# Patient Record
Sex: Male | Born: 1960 | Race: Black or African American | Hispanic: No | Smoking: Former smoker
Health system: Southern US, Community
[De-identification: ages and names within clinical notes are randomized; demographics above are authoritative.]

## PROBLEM LIST (undated history)

## (undated) DIAGNOSIS — B20 Human immunodeficiency virus [HIV] disease: Secondary | ICD-10-CM

## (undated) DIAGNOSIS — Z21 Asymptomatic human immunodeficiency virus [HIV] infection status: Secondary | ICD-10-CM

## (undated) HISTORY — PX: PATELLA ARTHROPLASTY: SUR73

---

## 1997-10-17 ENCOUNTER — Emergency Department (HOSPITAL_COMMUNITY): Admission: EM | Admit: 1997-10-17 | Discharge: 1997-10-17 | Payer: Self-pay | Admitting: Emergency Medicine

## 2009-02-24 ENCOUNTER — Encounter: Admission: RE | Admit: 2009-02-24 | Discharge: 2009-02-24 | Payer: Self-pay | Admitting: Infectious Diseases

## 2010-07-14 ENCOUNTER — Other Ambulatory Visit: Payer: Self-pay | Admitting: Adult Health

## 2010-07-14 ENCOUNTER — Ambulatory Visit: Payer: Self-pay

## 2010-07-14 ENCOUNTER — Encounter: Payer: Self-pay | Admitting: Adult Health

## 2010-07-14 ENCOUNTER — Ambulatory Visit (INDEPENDENT_AMBULATORY_CARE_PROVIDER_SITE_OTHER): Payer: Self-pay

## 2010-07-14 DIAGNOSIS — B20 Human immunodeficiency virus [HIV] disease: Secondary | ICD-10-CM

## 2010-07-14 LAB — CONVERTED CEMR LAB
AST: 42 units/L — ABNORMAL HIGH (ref 0–37)
Albumin: 4.3 g/dL (ref 3.5–5.2)
Alkaline Phosphatase: 116 units/L (ref 39–117)
BUN: 12 mg/dL (ref 6–23)
Basophils Absolute: 0 10*3/uL (ref 0.0–0.1)
Basophils Relative: 0 % (ref 0–1)
Chlamydia, Swab/Urine, PCR: NEGATIVE
Creatinine, Ser: 1.17 mg/dL (ref 0.40–1.50)
Eosinophils Absolute: 0.1 10*3/uL (ref 0.0–0.7)
Eosinophils Relative: 2 % (ref 0–5)
GC Probe Amp, Urine: NEGATIVE
Glucose, Bld: 104 mg/dL — ABNORMAL HIGH (ref 70–99)
HCT: 44.6 % (ref 39.0–52.0)
HDL: 48 mg/dL (ref >39)
HIV 1 RNA Quant: <20 copies/mL (ref <20)
HIV-1 antibody: POSITIVE — AB
HIV-2 Ab: NEGATIVE
Hemoglobin: 15 g/dL (ref 13.0–17.0)
LDL Cholesterol: 130 mg/dL — ABNORMAL HIGH (ref 0–99)
Leukocytes, UA: NEGATIVE
Lymphocytes Relative: 16 % (ref 12–46)
MCHC: 33.6 g/dL (ref 30.0–36.0)
Monocytes Absolute: 0.4 10*3/uL (ref 0.1–1.0)
Monocytes Relative: 11 % (ref 3–12)
Neutro Abs: 2.8 10*3/uL (ref 1.7–7.7)
Nitrite: NEGATIVE
Protein, ur: NEGATIVE mg/dL
RBC: 4.81 M/uL (ref 4.22–5.81)
RDW: 14.3 % (ref 11.5–15.5)
Specific Gravity, Urine: 1.022 (ref 1.005–1.030)
Total CHOL/HDL Ratio: 4.2
Urobilinogen, UA: 0.2 (ref 0.0–1.0)

## 2010-07-15 LAB — T-HELPER CELL (CD4) - (RCID CLINIC ONLY): CD4 % Helper T Cell: 39 % (ref 33–55)

## 2010-07-22 NOTE — Miscellaneous (Signed)
Summary: Orders Update  Clinical Lists Changes  Problems: Added new problem of HIV INFECTION (ICD-042) - Signed Orders: Added new Test order of T-RPR (Syphilis) [phone]) - Signed Added new Test order of T-GC Probe, urine [phone]) - Signed Added new Test order of T-Chlamydia  Probe, urine [phone]) - Signed Added new Test order of T-CD4SP [facility]) (CD4SP) - Signed Added new Test order of T-HIV Viral Load [phone]) - Signed Added new Test order of T-Comprehensive Metabolic Panel ([id number]) - Signed Added new Test order of T-Lipid Profile ([id number]) - Signed Added new Test order of T-CBC w/Diff ([id number]) - Signed Added new Test order of T-HIV Genotype ([id number]) - Signed Added new Test order of T-Urinalysis ([id number]) - Signed Added new Test order of T-HIV Ab Confirmatory Test/Western Blot ([id number]) - Signed     Process Orders Check Orders Results:     [facility]: Order checked:     Talmadge Chad NP NOT AUTHORIZED TO ORDER Tests Sent for requisitioning (July 14, 2010 1:51 PM):     07/14/2010: [facility] -- T-RPR (Syphilis) [phone] (signed)     07/14/2010: [facility] -- T-GC Probe, urine [phone] (signed)     07/14/2010: [facility] -- T-Chlamydia  Probe, urine [phone] (signed)     07/14/2010: [facility] -- T-HIV Viral Load [phone] (signed)     07/14/2010: [facility] -- T-Comprehensive Metabolic Panel [[id number]] (signed)     07/14/2010: [facility] -- T-Lipid Profile [phone] (signed)     07/14/2010: [facility] -- T-CBC w/Diff [[id number]] (signed)     07/14/2010: [facility] -- T-HIV Genotype [phone] (signed)     07/14/2010: [facility] -- T-Urinalysis [[id number]] (signed)     07/14/2010: [facility] -- T-HIV Ab  Confirmatory Test/Western Blot [phone] (signed)

## 2010-07-28 ENCOUNTER — Ambulatory Visit (INDEPENDENT_AMBULATORY_CARE_PROVIDER_SITE_OTHER): Payer: Self-pay | Admitting: Adult Health

## 2010-07-28 DIAGNOSIS — Z0000 Encounter for general adult medical examination without abnormal findings: Secondary | ICD-10-CM

## 2010-08-26 ENCOUNTER — Encounter: Payer: Self-pay | Admitting: Adult Health

## 2010-08-26 ENCOUNTER — Ambulatory Visit (INDEPENDENT_AMBULATORY_CARE_PROVIDER_SITE_OTHER): Payer: Self-pay | Admitting: Adult Health

## 2010-08-26 VITALS — BP 108/71 | HR 85 | Temp 98.6°F | Ht 74.0 in | Wt 170.4 lb

## 2010-08-26 DIAGNOSIS — B20 Human immunodeficiency virus [HIV] disease: Secondary | ICD-10-CM

## 2010-08-26 DIAGNOSIS — Z21 Asymptomatic human immunodeficiency virus [HIV] infection status: Secondary | ICD-10-CM

## 2010-08-27 LAB — T-HELPER CELL (CD4) - (RCID CLINIC ONLY): CD4 % Helper T Cell: 39 % (ref 33–55)

## 2010-08-27 LAB — COMPREHENSIVE METABOLIC PANEL
ALT: 32 U/L (ref 0–53)
AST: 44 U/L — ABNORMAL HIGH (ref 0–37)
Alkaline Phosphatase: 117 U/L (ref 39–117)
CO2: 24 mEq/L (ref 19–32)
Creat: 1.05 mg/dL (ref 0.40–1.50)
Sodium: 135 mEq/L (ref 135–145)
Total Bilirubin: 0.2 mg/dL — ABNORMAL LOW (ref 0.3–1.2)

## 2010-08-27 LAB — CBC WITH DIFFERENTIAL/PLATELET
Basophils Absolute: 0 10*3/uL (ref 0.0–0.1)
Basophils Relative: 0 % (ref 0–1)
Eosinophils Absolute: 0.1 10*3/uL (ref 0.0–0.7)
Eosinophils Relative: 2 % (ref 0–5)
MCH: 31.9 pg (ref 26.0–34.0)
MCHC: 33.8 g/dL (ref 30.0–36.0)
MCV: 94.5 fL (ref 78.0–100.0)
Neutrophils Relative %: 68 % (ref 43–77)
Platelets: 269 10*3/uL (ref 150–400)
RDW: 13.6 % (ref 11.5–15.5)

## 2010-08-28 LAB — HIV-1 RNA QUANT-NO REFLEX-BLD
HIV 1 RNA Quant: <20 {copies}/mL (ref <20)
HIV-1 RNA Quant, Log: <1.3 {Log} (ref <1.30)

## 2010-09-09 ENCOUNTER — Ambulatory Visit (INDEPENDENT_AMBULATORY_CARE_PROVIDER_SITE_OTHER): Payer: Self-pay | Admitting: Adult Health

## 2010-09-09 ENCOUNTER — Ambulatory Visit: Payer: Self-pay

## 2010-09-09 DIAGNOSIS — Z79899 Other long term (current) drug therapy: Secondary | ICD-10-CM

## 2010-09-09 DIAGNOSIS — B20 Human immunodeficiency virus [HIV] disease: Secondary | ICD-10-CM

## 2010-09-09 NOTE — Progress Notes (Signed)
  Subjective:    Patient ID: Scott Khan, male    DOB: 12/06/1960, 50 y.o.   MRN: [id number]  HPI Doing well, but still has not received ARV's through [facility].  Had some leftover meds from prison which he he used until two datys back.  Voices no new physical complaints at present.    Review of Systems  HENT: Negative.   Eyes: Negative.   Respiratory: Negative.   Cardiovascular: Negative.   Gastrointestinal: Negative.   Genitourinary: Negative.   Musculoskeletal: Negative.   Skin: Negative.   Neurological: Negative.   Hematological: Negative.   Psychiatric/Behavioral: Negative.        Objective:   Physical Exam  Constitutional: He is oriented to person, place, and time. He appears well-developed and well-nourished.  HENT:  Head: Normocephalic and atraumatic.  Mouth/Throat: Oropharynx is clear and moist.  Eyes: Conjunctivae and EOM are normal. Pupils are equal, round, and reactive to light.  Neck: Normal range of motion. Neck supple.  Cardiovascular: Normal rate, normal heart sounds and intact distal pulses.   Pulmonary/Chest: Effort normal and breath sounds normal.  Abdominal: Soft. Bowel sounds are normal.  Musculoskeletal: Normal range of motion.  Neurological: He is alert and oriented to person, place, and time. Coordination normal.  Skin: Skin is warm and dry. No rash noted.  Psychiatric: He has a normal mood and affect. His behavior is normal. Judgment and thought content normal.          Assessment & Plan:  HIV:  CD4 410 @ 39% with VL < 20 copies/ml (08/26/10).  Clinically stable.  Recommend to CPM with repeat labs in 6 weeks and 2 month f/u.  Determined his numbers for [facility] were active, but pharmacy did not have his processed paperwork.  We will call [facility] with the appropriate inf9ormation and ask him to go directly to the pharmacy to p/u meds.Marland Kitchen

## 2010-09-14 ENCOUNTER — Emergency Department (HOSPITAL_COMMUNITY)
Admission: EM | Admit: 2010-09-14 | Discharge: 2010-09-14 | Disposition: A | Discharge status: Home / Self Care | Payer: Self-pay | Attending: Emergency Medicine | Admitting: Emergency Medicine

## 2010-09-14 ENCOUNTER — Emergency Department (HOSPITAL_COMMUNITY): Payer: Self-pay

## 2010-09-14 ENCOUNTER — Ambulatory Visit: Payer: Self-pay

## 2010-09-14 DIAGNOSIS — IMO0002 Reserved for concepts with insufficient information to code with codable children: Secondary | ICD-10-CM | POA: Insufficient documentation

## 2010-09-14 DIAGNOSIS — M25519 Pain in unspecified shoulder: Secondary | ICD-10-CM | POA: Insufficient documentation

## 2010-09-14 DIAGNOSIS — S42143A Displaced fracture of glenoid cavity of scapula, unspecified shoulder, initial encounter for closed fracture: Secondary | ICD-10-CM | POA: Insufficient documentation

## 2010-09-14 DIAGNOSIS — Y9372 Activity, wrestling: Secondary | ICD-10-CM | POA: Insufficient documentation

## 2010-10-21 ENCOUNTER — Other Ambulatory Visit (INDEPENDENT_AMBULATORY_CARE_PROVIDER_SITE_OTHER): Payer: Self-pay

## 2010-10-21 DIAGNOSIS — B20 Human immunodeficiency virus [HIV] disease: Secondary | ICD-10-CM

## 2010-10-21 DIAGNOSIS — Z79899 Other long term (current) drug therapy: Secondary | ICD-10-CM

## 2010-10-21 LAB — COMPREHENSIVE METABOLIC PANEL
ALT: 61 U/L — ABNORMAL HIGH (ref 0–53)
AST: 53 U/L — ABNORMAL HIGH (ref 0–37)
Albumin: 4.1 g/dL (ref 3.5–5.2)
BUN: 11 mg/dL (ref 6–23)
Calcium: 9.6 mg/dL (ref 8.4–10.5)
Chloride: 105 mEq/L (ref 96–112)
Potassium: 4.4 mEq/L (ref 3.5–5.3)

## 2010-10-21 LAB — LIPID PANEL
LDL Cholesterol: 163 mg/dL — ABNORMAL HIGH (ref 0–99)
VLDL: 18 mg/dL (ref 0–40)

## 2010-10-22 LAB — CBC WITH DIFFERENTIAL/PLATELET
Basophils Absolute: 0 10*3/uL (ref 0.0–0.1)
HCT: 46.2 % (ref 39.0–52.0)
Lymphocytes Relative: 16 % (ref 12–46)
Lymphs Abs: 0.8 10*3/uL (ref 0.7–4.0)
Monocytes Absolute: 0.5 10*3/uL (ref 0.1–1.0)
Neutro Abs: 3.6 10*3/uL (ref 1.7–7.7)
RBC: 5.02 MIL/uL (ref 4.22–5.81)
RDW: 14 % (ref 11.5–15.5)
WBC: 5.1 10*3/uL (ref 4.0–10.5)

## 2010-10-22 LAB — T-HELPER CELL (CD4) - (RCID CLINIC ONLY): CD4 % Helper T Cell: 37 % (ref 33–55)

## 2010-11-09 ENCOUNTER — Ambulatory Visit: Payer: Self-pay | Admitting: Adult Health

## 2010-11-10 ENCOUNTER — Ambulatory Visit (INDEPENDENT_AMBULATORY_CARE_PROVIDER_SITE_OTHER): Payer: Self-pay | Admitting: Internal Medicine

## 2010-11-10 ENCOUNTER — Encounter: Payer: Self-pay | Admitting: Internal Medicine

## 2010-11-10 VITALS — BP 118/77 | HR 90 | Temp 98.3°F | Ht 74.0 in | Wt 163.0 lb

## 2010-11-10 DIAGNOSIS — R7401 Elevation of levels of liver transaminase levels: Secondary | ICD-10-CM

## 2010-11-10 DIAGNOSIS — B20 Human immunodeficiency virus [HIV] disease: Secondary | ICD-10-CM

## 2010-11-10 NOTE — Assessment & Plan Note (Signed)
Doing well.  Will recheck labs to assure not becoming resistant since he was off  A few days.  Also will check vaccine titers to see if he needs hepatitis shots.  Had pneumovax in 2011.

## 2010-11-10 NOTE — Progress Notes (Signed)
  Subjective:    Patient ID: Scott Khan, male    DOB: 12-Aug-1960, 50 y.o.   MRN: [id number]  HPI here for follow up.  Recently released from jail and now has [facility]AP.  Has been on Atripla and continues to take it, though he was out for a short period.  He describes no side effects, no rash.  Recently married.  No weight loss.  Tolerating meds and denies any missed doses.  CD4 nadir and VL not known.  No vaccine history known.      Review of Systems  Constitutional: Negative.   HENT: Negative.   Eyes: Negative.   Respiratory: Negative.   Cardiovascular: Negative.   Gastrointestinal: Negative.   Genitourinary: Negative.   Musculoskeletal: Negative.   Skin: Negative.   Neurological: Negative.   Hematological: Negative.   Psychiatric/Behavioral: Negative.        Objective:   Physical Exam  Constitutional: He is oriented to person, place, and time. He appears well-developed and well-nourished. No distress.  HENT:  Mouth/Throat: Oropharynx is clear and moist. No oropharyngeal exudate.  Eyes: Right eye exhibits no discharge. Left eye exhibits no discharge. No scleral icterus.  Neck: Normal range of motion. Neck supple.  Cardiovascular: Normal rate, regular rhythm and normal heart sounds.  Exam reveals no gallop.   No murmur heard. Pulmonary/Chest: Effort normal and breath sounds normal. No respiratory distress. He has no wheezes.  Abdominal: Soft. Bowel sounds are normal. He exhibits no distension. There is no tenderness. There is no rebound.  Musculoskeletal: Normal range of motion.  Lymphadenopathy:    He has no cervical adenopathy.  Neurological: He is alert and oriented to person, place, and time.  Skin: Skin is warm and dry. No erythema.  Psychiatric: He has a normal mood and affect. His behavior is normal.          Assessment & Plan:

## 2010-11-10 NOTE — Assessment & Plan Note (Signed)
Worse from previous.  Will check hepatitis studies and follow.  Denies significant alcohol.

## 2010-12-09 ENCOUNTER — Other Ambulatory Visit: Payer: Self-pay | Admitting: *Deleted

## 2010-12-09 DIAGNOSIS — B20 Human immunodeficiency virus [HIV] disease: Secondary | ICD-10-CM

## 2010-12-09 MED ORDER — EFAVIRENZ-EMTRICITAB-TENOFOVIR 600-200-300 MG PO TABS: 1.0000 | ORAL_TABLET | Freq: Every day | ORAL | Status: DC

## 2011-02-11 ENCOUNTER — Other Ambulatory Visit: Payer: Self-pay

## 2011-02-12 ENCOUNTER — Other Ambulatory Visit (INDEPENDENT_AMBULATORY_CARE_PROVIDER_SITE_OTHER): Payer: Self-pay

## 2011-02-12 DIAGNOSIS — B20 Human immunodeficiency virus [HIV] disease: Secondary | ICD-10-CM

## 2011-02-12 LAB — CBC WITH DIFFERENTIAL/PLATELET
Basophils Relative: 0 % (ref 0–1)
Eosinophils Absolute: 0.1 10*3/uL (ref 0.0–0.7)
MCH: 33.4 pg (ref 26.0–34.0)
MCHC: 35.3 g/dL (ref 30.0–36.0)
Monocytes Relative: 16 % — ABNORMAL HIGH (ref 3–12)
Neutrophils Relative %: 66 % (ref 43–77)
Platelets: 226 10*3/uL (ref 150–400)
RDW: 13.4 % (ref 11.5–15.5)

## 2011-02-12 LAB — COMPLETE METABOLIC PANEL WITH GFR
Alkaline Phosphatase: 107 U/L (ref 39–117)
GFR, Est Non African American: >60 mL/min (ref >60)
Glucose, Bld: 107 mg/dL — ABNORMAL HIGH (ref 70–99)
Sodium: 144 mEq/L (ref 135–145)
Total Bilirubin: 0.5 mg/dL (ref 0.3–1.2)
Total Protein: 7.4 g/dL (ref 6.0–8.3)

## 2011-02-12 LAB — T-HELPER CELL (CD4) - (RCID CLINIC ONLY): CD4 % Helper T Cell: 38 % (ref 33–55)

## 2011-02-13 LAB — HEPATITIS B CORE ANTIBODY, TOTAL: Hep B Core Total Ab: NEGATIVE

## 2011-02-13 LAB — HEPATITIS A ANTIBODY, TOTAL: Hep A Total Ab: POSITIVE — AB

## 2011-02-15 LAB — HIV-1 RNA ULTRAQUANT REFLEX TO GENTYP+: HIV 1 RNA Quant: <20 copies/mL (ref <20)

## 2011-02-25 ENCOUNTER — Ambulatory Visit: Payer: Self-pay | Admitting: Internal Medicine

## 2011-03-04 ENCOUNTER — Ambulatory Visit (INDEPENDENT_AMBULATORY_CARE_PROVIDER_SITE_OTHER): Payer: Self-pay | Admitting: Internal Medicine

## 2011-03-04 ENCOUNTER — Encounter: Payer: Self-pay | Admitting: Internal Medicine

## 2011-03-04 VITALS — BP 122/88 | HR 89 | Temp 98.7°F | Ht 74.0 in | Wt 157.0 lb

## 2011-03-04 DIAGNOSIS — B20 Human immunodeficiency virus [HIV] disease: Secondary | ICD-10-CM

## 2011-03-04 DIAGNOSIS — Z113 Encounter for screening for infections with a predominantly sexual mode of transmission: Secondary | ICD-10-CM

## 2011-03-04 NOTE — Assessment & Plan Note (Signed)
He continues to do well but I am concerned with his noncompliance on occasion. I did encourage and discussed with him that resistance can occur and if he becomes resistant he will have less once a day at [facility] pill [facility]. He did acknowledge this. Despite nonclassical this time though he has undetectable viral load is reassuring. I did discuss condom use with him and the need for prevention. I also cannot talk about the long-term effects of medications and need for good healthy eating and exercise.

## 2011-03-04 NOTE — Progress Notes (Signed)
  Subjective:    Patient ID: Scott Khan, male    DOB: Aug 05, 1960, 50 y.o.   MRN: [id number]  HPIhe comes in today for followup for his HIV. I saw him several months ago as a new patient after his release from jail. He had been on Atripla in jail and has continued on that. His viral load continues to be undetectable. He does tell me that he has had sporadic times of missing his doses. He though has had no intolerance to medications and no side effects that have prevented him from taking it. He today has no particular complaints and feels well.he also tells me that he recently had a flu shot though he is unsure if he actually got it or not.    Review of Systems  Constitutional: Negative for fever, activity change and appetite change.  HENT: Negative for congestion and rhinorrhea.   Respiratory: Negative for cough, chest tightness and wheezing.   Cardiovascular: Negative for leg swelling.  Gastrointestinal: Negative for diarrhea and constipation.  Genitourinary: Negative for discharge.  Musculoskeletal: Negative for myalgias and arthralgias.  Skin: Negative for rash.  Neurological: Negative for headaches.       Objective:   Physical Exam  Constitutional: He is oriented to person, place, and time. He appears well-developed and well-nourished.  HENT:  Mouth/Throat: No oropharyngeal exudate.  Cardiovascular: Normal rate, regular rhythm and normal heart sounds.   No murmur heard. Pulmonary/Chest: Effort normal and breath sounds normal. He has no wheezes.  Abdominal: Soft. Bowel sounds are normal. There is no tenderness.  Genitourinary: Penis normal.  Lymphadenopathy:    He has no cervical adenopathy.  Neurological: He is alert and oriented to person, place, and time.  Skin: Skin is warm and dry. No rash noted.  Psychiatric: He has a normal mood and affect. His behavior is normal.          Assessment & Plan:

## 2011-03-04 NOTE — Patient Instructions (Signed)
Take your medicines daily without missed doses.  If you miss a nightly dose, take it in early am.

## 2011-03-16 ENCOUNTER — Emergency Department (HOSPITAL_COMMUNITY): Payer: Self-pay

## 2011-03-16 ENCOUNTER — Emergency Department (HOSPITAL_COMMUNITY)
Admission: EM | Admit: 2011-03-16 | Discharge: 2011-03-17 | Disposition: A | Discharge status: Home / Self Care | Payer: Self-pay | Attending: General Surgery | Admitting: General Surgery

## 2011-03-16 DIAGNOSIS — Z21 Asymptomatic human immunodeficiency virus [HIV] infection status: Secondary | ICD-10-CM | POA: Insufficient documentation

## 2011-03-16 DIAGNOSIS — S21109A Unspecified open wound of unspecified front wall of thorax without penetration into thoracic cavity, initial encounter: Secondary | ICD-10-CM | POA: Insufficient documentation

## 2011-03-16 LAB — COMPREHENSIVE METABOLIC PANEL
Alkaline Phosphatase: 102 U/L (ref 39–117)
BUN: 14 mg/dL (ref 6–23)
CO2: 22 mEq/L (ref 19–32)
Chloride: 100 mEq/L (ref 96–112)
GFR calc Af Amer: >90 mL/min (ref >90)
Glucose, Bld: 95 mg/dL (ref 70–99)
Potassium: 4.1 mEq/L (ref 3.5–5.1)
Total Bilirubin: 0.2 mg/dL — ABNORMAL LOW (ref 0.3–1.2)

## 2011-03-16 LAB — POCT I-STAT, CHEM 8
BUN: 15 mg/dL (ref 6–23)
Calcium, Ion: 1.12 mmol/L (ref 1.12–1.32)
Chloride: 103 meq/L (ref 96–112)
Creatinine, Ser: 1.5 mg/dL — ABNORMAL HIGH (ref 0.50–1.35)
Glucose, Bld: 91 mg/dL (ref 70–99)
HCT: 48 % (ref 39.0–52.0)

## 2011-03-16 LAB — TYPE AND SCREEN: Antibody Screen: NEGATIVE

## 2011-03-16 LAB — CBC
HCT: 43.8 % (ref 39.0–52.0)
MCV: 93.4 fL (ref 78.0–100.0)
RDW: 12.8 % (ref 11.5–15.5)
WBC: 6.9 10*3/uL (ref 4.0–10.5)

## 2011-03-16 LAB — LACTIC ACID, PLASMA: Lactic Acid, Venous: 1.4 mmol/L (ref 0.5–2.2)

## 2011-03-19 ENCOUNTER — Emergency Department (HOSPITAL_COMMUNITY)
Admission: EM | Admit: 2011-03-19 | Discharge: 2011-03-19 | Disposition: A | Discharge status: Home / Self Care | Payer: Self-pay | Attending: Emergency Medicine | Admitting: Emergency Medicine

## 2011-03-19 DIAGNOSIS — Z21 Asymptomatic human immunodeficiency virus [HIV] infection status: Secondary | ICD-10-CM | POA: Insufficient documentation

## 2011-03-19 DIAGNOSIS — Z09 Encounter for follow-up examination after completed treatment for conditions other than malignant neoplasm: Secondary | ICD-10-CM | POA: Insufficient documentation

## 2011-03-19 DIAGNOSIS — S21109A Unspecified open wound of unspecified front wall of thorax without penetration into thoracic cavity, initial encounter: Secondary | ICD-10-CM | POA: Insufficient documentation

## 2011-03-26 ENCOUNTER — Emergency Department (HOSPITAL_COMMUNITY)
Admission: EM | Admit: 2011-03-26 | Discharge: 2011-03-26 | Disposition: A | Discharge status: Home / Self Care | Payer: Self-pay | Attending: Emergency Medicine | Admitting: Emergency Medicine

## 2011-03-26 DIAGNOSIS — Z4802 Encounter for removal of sutures: Secondary | ICD-10-CM | POA: Insufficient documentation

## 2011-04-06 ENCOUNTER — Ambulatory Visit: Payer: Self-pay

## 2011-07-01 NOTE — Progress Notes (Signed)
Subjective:    Patient ID: Scott Khan is a 51 y.o. male.  Chief Complaint: HIV Follow-up Visit Scott Khan is here for follow-up of HIV infection. He is feeling unchanged since his last visit.  He claims continued adherence to therapy with good tolerance and no complications. There are not additional complaints.   Data Review: Diagnostic studies reviewed.  Review of Systems - General ROS: negative for - chills, fatigue or fever Psychological ROS: negative for - anxiety, behavioral disorder, concentration difficulties or mood swings Endocrine ROS: negative for - polydipsia/polyuria Respiratory ROS: no cough, shortness of breath, or wheezing Cardiovascular ROS: no chest pain or dyspnea on exertion Gastrointestinal ROS: no abdominal pain, change in bowel habits, or black or bloody stools Neurological ROS: no TIA or stroke symptoms Dermatological ROS: negative for rash and skin lesion changes  Objective:  General appearance: alert, cooperative, appears stated age and no distress Head: Normocephalic, without obvious abnormality, atraumatic Neck: no adenopathy, no carotid bruit, no JVD, supple, symmetrical, trachea midline and thyroid not enlarged, symmetric, no tenderness/mass/nodules Resp: clear to auscultation bilaterally Cardio: regular rate and rhythm, S1, S2 normal, no murmur, click, rub or gallop Extremities: extremities normal, atraumatic, no cyanosis or edema Neurologic: Alert and oriented X 3, normal strength and tone. Normal symmetric reflexes. Normal coordination and gait Skin:  No active lesions or rashes noted.    Psych:  No vegetative signs or delusional behaviors noted.    Laboratory: From 07/14/10 ,  CD4 count was 250 c/cmm @ 39%. Viral load <20 copies/ml.     Assessment/Plan:   HIV INFECTION Clinically stable on current regimen. Continue present management.  Counseling provided on prevention of transmission of HIV. Condoms offered:  Yes Medication  adherence discussed with patient. Repeat staging labs today.  And then:  Follow up visit in 4 months with labs 2 weeks prior to appointment. Patient acknowledged information provided to them and agreed with plan of care.        Kennette Cuthrell A. Sundra Aland, MS, [facility] [phone]  07/01/2011, 3:53 PM

## 2011-07-01 NOTE — Assessment & Plan Note (Signed)
Clinically stable on current regimen. Continue present management.  Counseling provided on prevention of transmission of HIV. Condoms offered:  Yes Medication adherence discussed with patient.  Follow up visit in 4 months with labs 2 weeks prior to appointment. Patient acknowledged information provided to them and agreed with plan of care.

## 2011-07-30 ENCOUNTER — Other Ambulatory Visit: Payer: Self-pay | Admitting: *Deleted

## 2011-07-30 DIAGNOSIS — B20 Human immunodeficiency virus [HIV] disease: Secondary | ICD-10-CM

## 2011-07-30 MED ORDER — EFAVIRENZ-EMTRICITAB-TENOFOVIR 600-200-300 MG PO TABS: 1.0000 | ORAL_TABLET | Freq: Every day | ORAL | Status: DC

## 2011-08-25 ENCOUNTER — Other Ambulatory Visit (INDEPENDENT_AMBULATORY_CARE_PROVIDER_SITE_OTHER): Payer: Self-pay

## 2011-08-25 DIAGNOSIS — Z113 Encounter for screening for infections with a predominantly sexual mode of transmission: Secondary | ICD-10-CM

## 2011-08-25 DIAGNOSIS — B20 Human immunodeficiency virus [HIV] disease: Secondary | ICD-10-CM

## 2011-08-25 LAB — CBC WITH DIFFERENTIAL/PLATELET
Basophils Relative: 0 % (ref 0–1)
Eosinophils Absolute: 0 10*3/uL (ref 0.0–0.7)
HCT: 44.4 % (ref 39.0–52.0)
Hemoglobin: 15.1 g/dL (ref 13.0–17.0)
Lymphs Abs: 1.2 10*3/uL (ref 0.7–4.0)
MCH: 33.4 pg (ref 26.0–34.0)
MCHC: 34 g/dL (ref 30.0–36.0)
Monocytes Absolute: 0.3 10*3/uL (ref 0.1–1.0)
Monocytes Relative: 7 % (ref 3–12)
RBC: 4.52 MIL/uL (ref 4.22–5.81)

## 2011-08-25 LAB — COMPLETE METABOLIC PANEL WITH GFR
ALT: 35 U/L (ref 0–53)
AST: 39 U/L — ABNORMAL HIGH (ref 0–37)
Alkaline Phosphatase: 96 U/L (ref 39–117)
CO2: 25 mEq/L (ref 19–32)
Sodium: 135 mEq/L (ref 135–145)
Total Bilirubin: 0.4 mg/dL (ref 0.3–1.2)
Total Protein: 7.3 g/dL (ref 6.0–8.3)

## 2011-08-25 LAB — RPR

## 2011-08-26 ENCOUNTER — Ambulatory Visit: Payer: Self-pay

## 2011-08-26 LAB — T-HELPER CELL (CD4) - (RCID CLINIC ONLY)
CD4 % Helper T Cell: 45 % (ref 33–55)
CD4 T Cell Abs: 560 uL (ref 400–2700)

## 2011-08-27 LAB — HIV-1 RNA QUANT-NO REFLEX-BLD
HIV 1 RNA Quant: <20 copies/mL (ref <20)
HIV-1 RNA Quant, Log: <1.3 {Log} (ref <1.30)

## 2011-09-08 ENCOUNTER — Telehealth: Payer: Self-pay | Admitting: *Deleted

## 2011-09-08 NOTE — Telephone Encounter (Signed)
Patient called advised he is having a problem getting his medication refilled. Said that the pharmacy requested that we call them for some reason. Called the pharmacy and they said that all they need is the patient insurance information. Called patient back and advised him to call the pharmacy and advise them he has ADAP and help them find his ADAP number. He said he understood.

## 2011-09-13 ENCOUNTER — Ambulatory Visit (INDEPENDENT_AMBULATORY_CARE_PROVIDER_SITE_OTHER): Payer: Self-pay | Admitting: Internal Medicine

## 2011-09-13 ENCOUNTER — Encounter: Payer: Self-pay | Admitting: Internal Medicine

## 2011-09-13 VITALS — BP 112/77 | HR 89 | Temp 98.7°F | Ht 72.0 in | Wt 161.0 lb

## 2011-09-13 DIAGNOSIS — B20 Human immunodeficiency virus [HIV] disease: Secondary | ICD-10-CM

## 2011-09-13 DIAGNOSIS — Z113 Encounter for screening for infections with a predominantly sexual mode of transmission: Secondary | ICD-10-CM

## 2011-09-13 DIAGNOSIS — Z79899 Other long term (current) drug therapy: Secondary | ICD-10-CM

## 2011-09-13 NOTE — Assessment & Plan Note (Signed)
He continues to have minimally elevated LFTs. It is improved compared to previous. I did discuss with him reducing or quitting ideally alcohol intake.

## 2011-09-13 NOTE — Progress Notes (Signed)
  Subjective:    Patient ID: Scott Khan, male    DOB: 15-Dec-1960, 51 y.o.   MRN: [id number]  HPI he comes in today for routine followup. She has been followed in the clinic for over a year and has been on Atripla. He continues to have 100% compliance and no side effects. He's had no problems or hospitalizations since his last visit. He is exercising daily, he is eating right and uses condoms with sexual activity. He does smoke and does drink alcohol daily.    Review of Systems  Constitutional: Positive for chills. Negative for fever, activity change, appetite change and unexpected weight change.  HENT: Negative for sore throat and trouble swallowing.   Cardiovascular: Negative for chest pain, palpitations and leg swelling.  Gastrointestinal: Negative for nausea, abdominal pain, diarrhea and constipation.  Genitourinary: Negative for discharge and genital sores.  Musculoskeletal: Negative for myalgias, joint swelling and arthralgias.  Skin: Negative for pallor and rash.  Neurological: Negative for dizziness, weakness and headaches.  Hematological: Negative for adenopathy.  Psychiatric/Behavioral: Negative for dysphoric mood. The patient is not nervous/anxious.        Objective:   Physical Exam  Constitutional: He appears well-developed and well-nourished. No distress.  HENT:  Mouth/Throat: Oropharynx is clear and moist. No oropharyngeal exudate.  Cardiovascular: Normal rate, regular rhythm and normal heart sounds.  Exam reveals no gallop and no friction rub.   No murmur heard. Pulmonary/Chest: Effort normal and breath sounds normal. No respiratory distress. He has no wheezes. He has no rales.  Abdominal: Soft. Bowel sounds are normal. He exhibits no distension. There is no tenderness. There is no rebound.  Lymphadenopathy:    He has no cervical adenopathy.  Skin: Skin is warm and dry. No rash noted. No erythema.  Psychiatric: He has a normal mood and affect. His behavior is  normal.          Assessment & Plan:

## 2011-09-13 NOTE — Assessment & Plan Note (Signed)
He continues to do well and have an undetectable viral load. He did have a small increase in his creatinine previously but that has resolved. I did remind him to use condoms. I did discuss with him as well long-term management in someone like him who is able to take his medications every day. I discussed with him the need to quit smoking. I did discussed the long-term benefits of smoking cessation and that most likely smoking will be a much bigger issue than his HIV with his current good control. He is contemplative at this time, and I did discuss ways of cutting back. He will attempt to quit on his own. I also discussed with him the need for colon cancer screening in the future. I will have him return to clinic in 6 months but did remind him to return sooner if needed.

## 2012-01-25 ENCOUNTER — Ambulatory Visit: Payer: Self-pay

## 2012-01-27 ENCOUNTER — Ambulatory Visit: Payer: Self-pay

## 2012-02-07 ENCOUNTER — Ambulatory Visit: Payer: Self-pay

## 2012-03-02 ENCOUNTER — Other Ambulatory Visit: Payer: Self-pay

## 2012-03-02 ENCOUNTER — Ambulatory Visit: Payer: Self-pay

## 2012-03-03 ENCOUNTER — Other Ambulatory Visit (INDEPENDENT_AMBULATORY_CARE_PROVIDER_SITE_OTHER): Payer: No Typology Code available for payment source

## 2012-03-03 DIAGNOSIS — Z79899 Other long term (current) drug therapy: Secondary | ICD-10-CM

## 2012-03-03 DIAGNOSIS — B20 Human immunodeficiency virus [HIV] disease: Secondary | ICD-10-CM

## 2012-03-03 DIAGNOSIS — Z113 Encounter for screening for infections with a predominantly sexual mode of transmission: Secondary | ICD-10-CM

## 2012-03-03 LAB — CBC WITH DIFFERENTIAL/PLATELET
Basophils Absolute: 0 10*3/uL (ref 0.0–0.1)
Basophils Relative: 1 % (ref 0–1)
Eosinophils Absolute: 0.1 10*3/uL (ref 0.0–0.7)
Hemoglobin: 16.6 g/dL (ref 13.0–17.0)
MCH: 33.3 pg (ref 26.0–34.0)
MCHC: 35.4 g/dL (ref 30.0–36.0)
Monocytes Relative: 14 % — ABNORMAL HIGH (ref 3–12)
Neutro Abs: 1.7 10*3/uL (ref 1.7–7.7)
Neutrophils Relative %: 49 % (ref 43–77)
Platelets: 239 10*3/uL (ref 150–400)

## 2012-03-03 LAB — T-HELPER CELL (CD4) - (RCID CLINIC ONLY): CD4 T Cell Abs: 430 uL (ref 400–2700)

## 2012-03-04 LAB — LIPID PANEL
Cholesterol: 259 mg/dL — ABNORMAL HIGH (ref 0–200)
LDL Cholesterol: 170 mg/dL — ABNORMAL HIGH (ref 0–99)
Triglycerides: 78 mg/dL (ref <150)

## 2012-03-04 LAB — COMPREHENSIVE METABOLIC PANEL
ALT: 55 U/L — ABNORMAL HIGH (ref 0–53)
AST: 64 U/L — ABNORMAL HIGH (ref 0–37)
Albumin: 4.4 g/dL (ref 3.5–5.2)
Alkaline Phosphatase: 92 U/L (ref 39–117)
Potassium: 4.8 mEq/L (ref 3.5–5.3)
Sodium: 136 mEq/L (ref 135–145)
Total Bilirubin: 0.4 mg/dL (ref 0.3–1.2)
Total Protein: 7.5 g/dL (ref 6.0–8.3)

## 2012-03-05 LAB — HIV-1 RNA QUANT-NO REFLEX-BLD: HIV 1 RNA Quant: <20 copies/mL (ref <20)

## 2012-03-16 ENCOUNTER — Ambulatory Visit (INDEPENDENT_AMBULATORY_CARE_PROVIDER_SITE_OTHER): Payer: Self-pay | Admitting: Internal Medicine

## 2012-03-16 ENCOUNTER — Encounter: Payer: Self-pay | Admitting: Internal Medicine

## 2012-03-16 VITALS — BP 124/85 | HR 80 | Temp 98.0°F | Ht 74.0 in | Wt 161.0 lb

## 2012-03-16 DIAGNOSIS — F17200 Nicotine dependence, unspecified, uncomplicated: Secondary | ICD-10-CM

## 2012-03-16 DIAGNOSIS — B20 Human immunodeficiency virus [HIV] disease: Secondary | ICD-10-CM

## 2012-03-16 DIAGNOSIS — Z23 Encounter for immunization: Secondary | ICD-10-CM

## 2012-03-16 DIAGNOSIS — E785 Hyperlipidemia, unspecified: Secondary | ICD-10-CM

## 2012-03-20 DIAGNOSIS — E785 Hyperlipidemia, unspecified: Secondary | ICD-10-CM | POA: Insufficient documentation

## 2012-03-20 DIAGNOSIS — F17200 Nicotine dependence, unspecified, uncomplicated: Secondary | ICD-10-CM | POA: Insufficient documentation

## 2012-03-20 DIAGNOSIS — IMO0001 Reserved for inherently not codable concepts without codable children: Secondary | ICD-10-CM | POA: Insufficient documentation

## 2012-03-20 NOTE — Assessment & Plan Note (Signed)
Discussed results. Patient at this time wants to attempt diet modifications, particularly with alcohol reduction.  Will recheck fLP in one year.

## 2012-03-20 NOTE — Assessment & Plan Note (Signed)
Discussed risks of tobacco and discussed ways to quit.  To attempt on his own for now.

## 2012-03-20 NOTE — Assessment & Plan Note (Signed)
Doing well and no issues.  RTC 6 months.  

## 2012-03-20 NOTE — Progress Notes (Signed)
  Subjective:    Patient ID: Scott Khan, male    DOB: 16-Jan-1961, 51 y.o.   MRN: [id number]  HPI Here for 042 follow up.  Continues on Atripla and likes it.  Denies missed doses.  Does drink daily and smoke.  LDL noted to be up and patient aware.     Review of Systems  Constitutional: Negative for fever, fatigue and unexpected weight change.  HENT: Negative for sore throat and trouble swallowing.   Respiratory: Negative for cough and shortness of breath.   Cardiovascular: Negative for chest pain, palpitations and leg swelling.  Gastrointestinal: Negative for nausea, abdominal pain and diarrhea.  Musculoskeletal: Negative for myalgias, joint swelling and arthralgias.  Skin: Negative for rash.  Neurological: Negative for dizziness and headaches.       Objective:   Physical Exam  Constitutional: He appears well-developed and well-nourished. No distress.  Cardiovascular: Normal rate, regular rhythm and normal heart sounds.  Exam reveals no gallop and no friction rub.   No murmur heard. Pulmonary/Chest: Effort normal and breath sounds normal. No respiratory distress. He has no wheezes. He has no rales.  Abdominal: Soft. Bowel sounds are normal. He exhibits no distension. There is no tenderness. There is no rebound.          Assessment & Plan:

## 2012-03-20 NOTE — Assessment & Plan Note (Signed)
Persists.  Viral hepatitis studies negative.  Likely alcohol.  I discussed with him that it could lead to further liver damage.  Patient voiced his understanding and will try to quit.

## 2012-04-03 ENCOUNTER — Other Ambulatory Visit: Payer: Self-pay | Admitting: *Deleted

## 2012-04-03 DIAGNOSIS — B20 Human immunodeficiency virus [HIV] disease: Secondary | ICD-10-CM

## 2012-04-03 MED ORDER — EFAVIRENZ-EMTRICITAB-TENOFOVIR 600-200-300 MG PO TABS: 1.0000 | ORAL_TABLET | Freq: Every day | ORAL | Status: DC

## 2012-09-12 ENCOUNTER — Other Ambulatory Visit: Payer: Self-pay

## 2012-09-12 ENCOUNTER — Ambulatory Visit: Payer: Self-pay

## 2012-09-26 ENCOUNTER — Ambulatory Visit: Payer: Self-pay | Admitting: Internal Medicine

## 2012-09-26 ENCOUNTER — Telehealth: Payer: Self-pay | Admitting: *Deleted

## 2012-09-26 NOTE — Telephone Encounter (Signed)
Called patient and left voice mail to call the clinic to reschedule his lab and MD appt, he no showed both. Scott Khan

## 2013-01-11 ENCOUNTER — Telehealth: Payer: Self-pay | Admitting: *Deleted

## 2013-01-11 NOTE — Telephone Encounter (Signed)
Patient needs follow up referral made to [facility]

## 2013-01-11 NOTE — Telephone Encounter (Signed)
Message copied by Macy Mis on Thu Jan 11, 2013  4:30 PM ------      Message from: Gardiner Barefoot      Created: Thu Jan 11, 2013  3:10 PM       He is way overdue for follow up. Can you see if you can get him in.  Thanks ------

## 2013-02-21 ENCOUNTER — Telehealth: Payer: Self-pay | Admitting: *Deleted

## 2013-02-21 ENCOUNTER — Other Ambulatory Visit: Payer: Self-pay

## 2013-02-21 NOTE — Telephone Encounter (Signed)
Per [name] Counselor with [facility] patient is currently incarcerated. This is the reason for overdue office visit. Scott Khan

## 2013-03-19 ENCOUNTER — Other Ambulatory Visit: Payer: Self-pay

## 2013-03-20 ENCOUNTER — Other Ambulatory Visit (INDEPENDENT_AMBULATORY_CARE_PROVIDER_SITE_OTHER): Payer: Self-pay

## 2013-03-20 DIAGNOSIS — Z113 Encounter for screening for infections with a predominantly sexual mode of transmission: Secondary | ICD-10-CM

## 2013-03-20 DIAGNOSIS — B20 Human immunodeficiency virus [HIV] disease: Secondary | ICD-10-CM

## 2013-03-20 LAB — CBC WITH DIFFERENTIAL/PLATELET
Eosinophils Absolute: 0.1 10*3/uL (ref 0.0–0.7)
HCT: 37.4 % — ABNORMAL LOW (ref 39.0–52.0)
Lymphocytes Relative: 31 % (ref 12–46)
Lymphs Abs: 1.3 10*3/uL (ref 0.7–4.0)
Monocytes Absolute: 0.4 10*3/uL (ref 0.1–1.0)
Monocytes Relative: 9 % (ref 3–12)
Neutro Abs: 2.4 10*3/uL (ref 1.7–7.7)
Neutrophils Relative %: 57 % (ref 43–77)
Platelets: 262 10*3/uL (ref 150–400)
RBC: 4.23 MIL/uL (ref 4.22–5.81)
WBC: 4.2 10*3/uL (ref 4.0–10.5)

## 2013-03-21 LAB — COMPLETE METABOLIC PANEL WITH GFR
ALT: 33 U/L (ref 0–53)
Albumin: 4 g/dL (ref 3.5–5.2)
CO2: 22 mEq/L (ref 19–32)
Chloride: 104 mEq/L (ref 96–112)
GFR, Est African American: >89 mL/min
GFR, Est Non African American: >89 mL/min
Glucose, Bld: 90 mg/dL (ref 70–99)
Potassium: 4.1 mEq/L (ref 3.5–5.3)
Sodium: 136 mEq/L (ref 135–145)
Total Bilirubin: 0.3 mg/dL (ref 0.3–1.2)
Total Protein: 7 g/dL (ref 6.0–8.3)

## 2013-03-21 LAB — T-HELPER CELL (CD4) - (RCID CLINIC ONLY): CD4 % Helper T Cell: 44 % (ref 33–55)

## 2013-03-21 LAB — HIV-1 RNA QUANT-NO REFLEX-BLD: HIV 1 RNA Quant: <20 copies/mL (ref <20)

## 2013-04-03 ENCOUNTER — Ambulatory Visit (INDEPENDENT_AMBULATORY_CARE_PROVIDER_SITE_OTHER): Payer: Self-pay | Admitting: Internal Medicine

## 2013-04-03 ENCOUNTER — Encounter: Payer: Self-pay | Admitting: Internal Medicine

## 2013-04-03 VITALS — BP 111/74 | HR 83 | Temp 98.1°F | Ht 74.0 in | Wt 175.0 lb

## 2013-04-03 DIAGNOSIS — F17200 Nicotine dependence, unspecified, uncomplicated: Secondary | ICD-10-CM

## 2013-04-03 DIAGNOSIS — Z113 Encounter for screening for infections with a predominantly sexual mode of transmission: Secondary | ICD-10-CM

## 2013-04-03 DIAGNOSIS — Z79899 Other long term (current) drug therapy: Secondary | ICD-10-CM

## 2013-04-03 DIAGNOSIS — B20 Human immunodeficiency virus [HIV] disease: Secondary | ICD-10-CM

## 2013-04-03 NOTE — Assessment & Plan Note (Signed)
He is interested in quitting though is going to use his resources at church to help him

## 2013-04-03 NOTE — Progress Notes (Signed)
  Subjective:    Patient ID: Scott Khan, male    DOB: 1961/03/18, 52 y.o.   MRN: [id number]  HPI He comes in for followup of his HIV. He has not been seen in one year since he has been in jail. He has recently been released. He says he has reestablished with the drug assistance program through his case manager. He continues to take a trip and denies any missed doses either during jail or such as released. His labs do reflect that with a continued undetectable viral load and CD4 count of 570. He tells me he was recently baptized and is going to church. Him and his wife both continue to smoke. 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 He did have some discomfort in his right abdomen during his stay in jail which seem to be related to elevated liver function tests from excessive Tylenol use which is since stopped. No weight loss, no diarrhea.   Review of Systems  Constitutional: Negative for fever and unexpected weight change.  HENT: Negative for trouble swallowing.   Eyes: Negative for visual disturbance.  Respiratory: Negative for shortness of breath.   Cardiovascular: Negative for chest pain.  Gastrointestinal: Negative for abdominal pain, diarrhea and constipation.  Musculoskeletal: Negative for joint swelling.  Skin: Negative for rash.  Neurological: Negative for dizziness, light-headedness and headaches.  Hematological: Negative for adenopathy.  Psychiatric/Behavioral: Negative for dysphoric mood. The patient is not nervous/anxious.        Objective:   Physical Exam  Constitutional: He appears well-developed and well-nourished. No distress.  HENT:  Mouth/Throat: No oropharyngeal exudate.  Eyes: Right eye exhibits no discharge. Left eye exhibits no discharge. No scleral icterus.  Cardiovascular: Normal rate, regular rhythm and normal heart sounds.   No murmur heard. Pulmonary/Chest: Effort normal and breath sounds normal. No respiratory distress. He has no  wheezes.  Lymphadenopathy:    He has no cervical adenopathy.  Neurological: He is alert.  Skin: No rash noted.  Psychiatric: He has a normal mood and affect. His behavior is normal.          Assessment & Plan:

## 2013-04-03 NOTE — Assessment & Plan Note (Signed)
This has improved from previous. He continues to stay away from Tylenol

## 2013-04-03 NOTE — Assessment & Plan Note (Signed)
He is doing well on his Atripla and will continue He will return in 3 months

## 2013-04-06 ENCOUNTER — Ambulatory Visit: Payer: Self-pay | Attending: Internal Medicine

## 2013-04-09 DIAGNOSIS — R7611 Nonspecific reaction to tuberculin skin test without active tuberculosis: Secondary | ICD-10-CM | POA: Insufficient documentation

## 2013-04-18 ENCOUNTER — Other Ambulatory Visit: Payer: Self-pay | Admitting: Internal Medicine

## 2013-04-19 ENCOUNTER — Ambulatory Visit: Payer: Self-pay | Attending: Internal Medicine

## 2013-06-01 ENCOUNTER — Other Ambulatory Visit: Payer: Self-pay | Admitting: *Deleted

## 2013-06-01 DIAGNOSIS — B20 Human immunodeficiency virus [HIV] disease: Secondary | ICD-10-CM

## 2013-06-01 MED ORDER — EFAVIRENZ-EMTRICITAB-TENOFOVIR 600-200-300 MG PO TABS: 1.0000 | ORAL_TABLET | Freq: Every day | ORAL | Status: DC

## 2013-06-04 ENCOUNTER — Other Ambulatory Visit: Payer: Self-pay | Admitting: *Deleted

## 2013-06-04 DIAGNOSIS — B20 Human immunodeficiency virus [HIV] disease: Secondary | ICD-10-CM

## 2013-06-04 MED ORDER — EFAVIRENZ-EMTRICITAB-TENOFOVIR 600-200-300 MG PO TABS: 1.0000 | ORAL_TABLET | Freq: Every day | ORAL | Status: DC

## 2013-06-25 ENCOUNTER — Encounter: Payer: Self-pay | Admitting: *Deleted

## 2013-07-04 ENCOUNTER — Other Ambulatory Visit: Payer: Self-pay | Admitting: Internal Medicine

## 2013-07-05 ENCOUNTER — Other Ambulatory Visit (INDEPENDENT_AMBULATORY_CARE_PROVIDER_SITE_OTHER): Payer: Self-pay

## 2013-07-05 DIAGNOSIS — Z79899 Other long term (current) drug therapy: Secondary | ICD-10-CM

## 2013-07-05 DIAGNOSIS — B20 Human immunodeficiency virus [HIV] disease: Secondary | ICD-10-CM

## 2013-07-05 DIAGNOSIS — Z113 Encounter for screening for infections with a predominantly sexual mode of transmission: Secondary | ICD-10-CM

## 2013-07-05 LAB — CBC WITH DIFFERENTIAL/PLATELET
Basophils Absolute: 0 K/uL (ref 0.0–0.1)
Basophils Relative: 1 % (ref 0–1)
Eosinophils Absolute: 0.1 K/uL (ref 0.0–0.7)
Eosinophils Relative: 2 % (ref 0–5)
HCT: 43.7 % (ref 39.0–52.0)
Hemoglobin: 15.5 g/dL (ref 13.0–17.0)
Lymphocytes Relative: 26 % (ref 12–46)
Lymphs Abs: 1.1 K/uL (ref 0.7–4.0)
MCH: 32.6 pg (ref 26.0–34.0)
MCHC: 35.5 g/dL (ref 30.0–36.0)
MCV: 92 fL (ref 78.0–100.0)
Monocytes Absolute: 0.6 K/uL (ref 0.1–1.0)
Monocytes Relative: 14 % — ABNORMAL HIGH (ref 3–12)
Neutro Abs: 2.5 K/uL (ref 1.7–7.7)
Neutrophils Relative %: 57 % (ref 43–77)
Platelets: 248 K/uL (ref 150–400)
RBC: 4.75 MIL/uL (ref 4.22–5.81)
RDW: 14.2 % (ref 11.5–15.5)
WBC: 4.3 K/uL (ref 4.0–10.5)

## 2013-07-05 LAB — COMPLETE METABOLIC PANEL WITHOUT GFR
ALT: 27 U/L (ref 0–53)
AST: 40 U/L — ABNORMAL HIGH (ref 0–37)
Albumin: 4.3 g/dL (ref 3.5–5.2)
Alkaline Phosphatase: 116 U/L (ref 39–117)
BUN: 15 mg/dL (ref 6–23)
CO2: 25 meq/L (ref 19–32)
Calcium: 9.5 mg/dL (ref 8.4–10.5)
Chloride: 103 meq/L (ref 96–112)
Creat: 1.03 mg/dL (ref 0.50–1.35)
GFR, Est African American: >89 mL/min
GFR, Est Non African American: 84 mL/min
Glucose, Bld: 93 mg/dL (ref 70–99)
Potassium: 4.8 meq/L (ref 3.5–5.3)
Sodium: 137 meq/L (ref 135–145)
Total Bilirubin: 0.4 mg/dL (ref 0.2–1.2)
Total Protein: 7.4 g/dL (ref 6.0–8.3)

## 2013-07-05 LAB — LIPID PANEL
Cholesterol: 234 mg/dL — ABNORMAL HIGH (ref 0–200)
HDL: 86 mg/dL (ref >39)
LDL CALC: 137 mg/dL — AB (ref 0–99)
Total CHOL/HDL Ratio: 2.7 Ratio
Triglycerides: 54 mg/dL (ref <150)
VLDL: 11 mg/dL (ref 0–40)

## 2013-07-05 LAB — RPR

## 2013-07-06 LAB — T-HELPER CELL (CD4) - (RCID CLINIC ONLY)
CD4 T CELL HELPER: 38 % (ref 33–55)
CD4 T Cell Abs: 460 /uL (ref 400–2700)

## 2013-07-06 LAB — HIV-1 RNA QUANT-NO REFLEX-BLD: HIV 1 RNA Quant: <20 copies/mL (ref <20)

## 2013-07-19 ENCOUNTER — Encounter: Payer: Self-pay | Admitting: Internal Medicine

## 2013-07-19 ENCOUNTER — Ambulatory Visit (INDEPENDENT_AMBULATORY_CARE_PROVIDER_SITE_OTHER): Payer: Self-pay | Admitting: Internal Medicine

## 2013-07-19 VITALS — BP 117/82 | HR 91 | Temp 98.6°F | Ht 74.0 in | Wt 165.0 lb

## 2013-07-19 DIAGNOSIS — E785 Hyperlipidemia, unspecified: Secondary | ICD-10-CM

## 2013-07-19 DIAGNOSIS — R740 Nonspecific elevation of levels of transaminase and lactic acid dehydrogenase [LDH]: Secondary | ICD-10-CM

## 2013-07-19 DIAGNOSIS — F17200 Nicotine dependence, unspecified, uncomplicated: Secondary | ICD-10-CM

## 2013-07-19 DIAGNOSIS — R7401 Elevation of levels of liver transaminase levels: Secondary | ICD-10-CM

## 2013-07-19 DIAGNOSIS — R7402 Elevation of levels of lactic acid dehydrogenase (LDH): Secondary | ICD-10-CM

## 2013-07-19 DIAGNOSIS — B20 Human immunodeficiency virus [HIV] disease: Secondary | ICD-10-CM

## 2013-07-19 NOTE — Progress Notes (Signed)
  Subjective:    Patient ID: Scott Khan, male    DOB: 1960-06-22, 53 y.o.   MRN: [id number]  HPI  He comes in for followup of his HIV.  He continues with excellent compliance and no missed doses.  Labs good.  Does eat fatty food and LDL 136, though better than previous.  Still struggles with smoking. No weight loss, no diarrhea.   Review of Systems  Constitutional: Negative for fever and unexpected weight change.  HENT: Negative for trouble swallowing.   Eyes: Negative for visual disturbance.  Respiratory: Negative for shortness of breath.   Cardiovascular: Negative for chest pain.  Gastrointestinal: Negative for abdominal pain, diarrhea and constipation.  Musculoskeletal: Negative for joint swelling.  Skin: Negative for rash.  Neurological: Negative for dizziness, light-headedness and headaches.  Hematological: Negative for adenopathy.  Psychiatric/Behavioral: Negative for dysphoric mood. The patient is not nervous/anxious.        Objective:   Physical Exam  Constitutional: He appears well-developed and well-nourished. No distress.  HENT:  Mouth/Throat: No oropharyngeal exudate.  Eyes: Right eye exhibits no discharge. Left eye exhibits no discharge. No scleral icterus.  Cardiovascular: Normal rate, regular rhythm and normal heart sounds.   No murmur heard. Pulmonary/Chest: Effort normal and breath sounds normal. No respiratory distress. He has no wheezes.  Lymphadenopathy:    He has no cervical adenopathy.  Neurological: He is alert.  Skin: No rash noted.  Psychiatric: He has a normal mood and affect. His behavior is normal.          Assessment & Plan:

## 2013-07-19 NOTE — Assessment & Plan Note (Addendum)
Discussed diet and exercise.  Will watch, no indication for statin therapy at this time.

## 2013-07-19 NOTE — Assessment & Plan Note (Signed)
Improved.  Encouraged continued cessation attempts.

## 2013-07-19 NOTE — Assessment & Plan Note (Signed)
Doing well.  RTC 4 months.   

## 2013-07-19 NOTE — Assessment & Plan Note (Signed)
Stable.  Not sure of etiology with hep C negative.  Is improved so no intervention at this time.

## 2013-09-05 ENCOUNTER — Ambulatory Visit: Payer: Self-pay

## 2013-10-24 ENCOUNTER — Other Ambulatory Visit: Payer: Self-pay | Admitting: Internal Medicine

## 2013-10-29 ENCOUNTER — Other Ambulatory Visit (INDEPENDENT_AMBULATORY_CARE_PROVIDER_SITE_OTHER): Payer: Self-pay

## 2013-10-29 DIAGNOSIS — B20 Human immunodeficiency virus [HIV] disease: Secondary | ICD-10-CM

## 2013-10-30 LAB — HIV-1 RNA QUANT-NO REFLEX-BLD
HIV 1 RNA Quant: <20 copies/mL (ref <20)
HIV-1 RNA Quant, Log: <1.3 {Log} (ref <1.30)

## 2013-10-30 LAB — T-HELPER CELL (CD4) - (RCID CLINIC ONLY)
CD4 % Helper T Cell: 42 % (ref 33–55)
CD4 T Cell Abs: 500 /uL (ref 400–2700)

## 2013-11-19 ENCOUNTER — Ambulatory Visit (INDEPENDENT_AMBULATORY_CARE_PROVIDER_SITE_OTHER): Payer: Self-pay | Admitting: Internal Medicine

## 2013-11-19 ENCOUNTER — Encounter: Payer: Self-pay | Admitting: Internal Medicine

## 2013-11-19 VITALS — BP 123/83 | HR 80 | Temp 98.3°F | Ht 74.0 in | Wt 159.0 lb

## 2013-11-19 DIAGNOSIS — B20 Human immunodeficiency virus [HIV] disease: Secondary | ICD-10-CM

## 2013-11-19 NOTE — Progress Notes (Signed)
  Subjective:    Patient ID: Scott Khan, male    DOB: 1961/03/27, 53 y.o.   MRN: [id number]  HPI  He comes in for followup of his HIV.  He continues with excellent compliance and no missed doses.  Labs good with CD4 of 500 and undetectable vl.  Still struggles with smoking. No weight loss, no diarrhea.   Review of Systems  Constitutional: Negative for fever and unexpected weight change.  HENT: Negative for trouble swallowing.   Eyes: Negative for visual disturbance.  Respiratory: Negative for shortness of breath.   Cardiovascular: Negative for chest pain.  Gastrointestinal: Negative for abdominal pain, diarrhea and constipation.  Musculoskeletal: Negative for joint swelling.  Skin: Negative for rash.  Neurological: Negative for dizziness, light-headedness and headaches.  Hematological: Negative for adenopathy.  Psychiatric/Behavioral: Negative for dysphoric mood. The patient is not nervous/anxious.        Objective:   Physical Exam  Constitutional: He appears well-developed and well-nourished. No distress.  HENT:  Mouth/Throat: No oropharyngeal exudate.  Eyes: Right eye exhibits no discharge. Left eye exhibits no discharge. No scleral icterus.  Cardiovascular: Normal rate, regular rhythm and normal heart sounds.   No murmur heard. Pulmonary/Chest: Effort normal and breath sounds normal. No respiratory distress. He has no wheezes.  Lymphadenopathy:    He has no cervical adenopathy.  Neurological: He is alert.  Skin: No rash noted.  Psychiatric: He has a normal mood and affect. His behavior is normal.          Assessment & Plan:

## 2013-11-22 ENCOUNTER — Other Ambulatory Visit: Payer: Self-pay | Admitting: Internal Medicine

## 2013-12-10 ENCOUNTER — Encounter (HOSPITAL_COMMUNITY): Payer: Self-pay | Admitting: Emergency Medicine

## 2013-12-10 ENCOUNTER — Emergency Department (HOSPITAL_COMMUNITY): Payer: Self-pay

## 2013-12-10 ENCOUNTER — Emergency Department (HOSPITAL_COMMUNITY)
Admission: EM | Admit: 2013-12-10 | Discharge: 2013-12-10 | Disposition: A | Discharge status: Home / Self Care | Payer: Self-pay | Attending: Emergency Medicine | Admitting: Emergency Medicine

## 2013-12-10 DIAGNOSIS — K5900 Constipation, unspecified: Secondary | ICD-10-CM | POA: Insufficient documentation

## 2013-12-10 DIAGNOSIS — F17200 Nicotine dependence, unspecified, uncomplicated: Secondary | ICD-10-CM | POA: Insufficient documentation

## 2013-12-10 DIAGNOSIS — Z21 Asymptomatic human immunodeficiency virus [HIV] infection status: Secondary | ICD-10-CM | POA: Insufficient documentation

## 2013-12-10 HISTORY — DX: Human immunodeficiency virus (HIV) disease: B20

## 2013-12-10 HISTORY — DX: Asymptomatic human immunodeficiency virus (hiv) infection status: Z21

## 2013-12-10 NOTE — Discharge Instructions (Signed)

## 2013-12-10 NOTE — ED Notes (Signed)
Per EMS, Patient was thought to ingest a bag of cocaine when police arrived to gain custody. Patient is alert and oriented. Vitals per EMS: 132/98, 70 HR, 18 RR. Patient denies and has no complaints at this time.
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
## 2013-12-23 ENCOUNTER — Other Ambulatory Visit: Payer: Self-pay | Admitting: Internal Medicine

## 2014-03-07 ENCOUNTER — Other Ambulatory Visit (INDEPENDENT_AMBULATORY_CARE_PROVIDER_SITE_OTHER): Payer: Self-pay

## 2014-03-07 DIAGNOSIS — B20 Human immunodeficiency virus [HIV] disease: Secondary | ICD-10-CM

## 2014-03-08 LAB — HIV-1 RNA QUANT-NO REFLEX-BLD: HIV 1 RNA Quant: <20 copies/mL (ref <20)

## 2014-03-08 LAB — T-HELPER CELL (CD4) - (RCID CLINIC ONLY)
CD4 T CELL ABS: 580 /uL (ref 400–2700)
CD4 T CELL HELPER: 39 % (ref 33–55)

## 2014-03-21 ENCOUNTER — Encounter: Payer: Self-pay | Admitting: Internal Medicine

## 2014-03-21 ENCOUNTER — Ambulatory Visit (INDEPENDENT_AMBULATORY_CARE_PROVIDER_SITE_OTHER): Payer: Self-pay | Admitting: Internal Medicine

## 2014-03-21 VITALS — BP 140/83 | HR 83 | Temp 98.5°F | Wt 161.0 lb

## 2014-03-21 DIAGNOSIS — Z720 Tobacco use: Secondary | ICD-10-CM

## 2014-03-21 DIAGNOSIS — Z79899 Other long term (current) drug therapy: Secondary | ICD-10-CM

## 2014-03-21 DIAGNOSIS — Z23 Encounter for immunization: Secondary | ICD-10-CM

## 2014-03-21 DIAGNOSIS — R519 Headache, unspecified: Secondary | ICD-10-CM | POA: Insufficient documentation

## 2014-03-21 DIAGNOSIS — B20 Human immunodeficiency virus [HIV] disease: Secondary | ICD-10-CM

## 2014-03-21 DIAGNOSIS — R51 Headache: Secondary | ICD-10-CM

## 2014-03-21 DIAGNOSIS — Z113 Encounter for screening for infections with a predominantly sexual mode of transmission: Secondary | ICD-10-CM

## 2014-03-21 DIAGNOSIS — G44229 Chronic tension-type headache, not intractable: Secondary | ICD-10-CM

## 2014-03-21 DIAGNOSIS — F17200 Nicotine dependence, unspecified, uncomplicated: Secondary | ICD-10-CM

## 2014-03-21 NOTE — Assessment & Plan Note (Signed)
He is doing great.  RTC 4-5 months.

## 2014-03-21 NOTE — Assessment & Plan Note (Signed)
Encouraged cessation.

## 2014-03-21 NOTE — Assessment & Plan Note (Signed)
No concerning signs.  If persists he will discuss with his pcp.

## 2014-03-21 NOTE — Progress Notes (Signed)
  Subjective:    Patient ID: Scott Khan, male    DOB: [birth year], 53 y.o.   MRN: [id number]  HPI He comes in for followup of his HIV.  He continues with excellent compliance and no missed doses.  Labs good with CD4 of 580 and undetectable vl.  Still struggles with smoking. No weight loss, no diarrhea. + headache.  Is frontal, relieved with Tylenol.  Does not wake him up from sleep, no photophobia, no throbbing.  No n/v.     Review of Systems  Constitutional: Negative for fever and unexpected weight change.  HENT: Negative for trouble swallowing.   Eyes: Negative for visual disturbance.  Respiratory: Negative for shortness of breath.   Cardiovascular: Negative for chest pain.  Gastrointestinal: Negative for abdominal pain, diarrhea and constipation.  Musculoskeletal: Negative for joint swelling.  Skin: Negative for rash.  Neurological: Negative for dizziness, light-headedness and headaches.  Hematological: Negative for adenopathy.  Psychiatric/Behavioral: Negative for dysphoric mood. The patient is not nervous/anxious.        Objective:   Physical Exam  Constitutional: He appears well-developed and well-nourished. No distress.  HENT:  Mouth/Throat: No oropharyngeal exudate.  Eyes: Right eye exhibits no discharge. Left eye exhibits no discharge. No scleral icterus.  Cardiovascular: Normal rate, regular rhythm and normal heart sounds.   No murmur heard. Pulmonary/Chest: Effort normal and breath sounds normal. No respiratory distress. He has no wheezes.  Lymphadenopathy:    He has no cervical adenopathy.  Neurological: He is alert.  Skin: No rash noted.  Psychiatric: He has a normal mood and affect. His behavior is normal.          Assessment & Plan:

## 2014-03-27 ENCOUNTER — Ambulatory Visit (INDEPENDENT_AMBULATORY_CARE_PROVIDER_SITE_OTHER): Payer: Self-pay | Admitting: *Deleted

## 2014-03-27 VITALS — BP 112/81 | HR 90 | Temp 98.5°F | Resp 16 | Ht 74.0 in | Wt 163.5 lb

## 2014-03-27 DIAGNOSIS — B20 Human immunodeficiency virus [HIV] disease: Secondary | ICD-10-CM

## 2014-03-27 DIAGNOSIS — Z006 Encounter for examination for normal comparison and control in clinical research program: Secondary | ICD-10-CM

## 2014-03-27 LAB — COMPREHENSIVE METABOLIC PANEL
ALT: 41 U/L (ref 0–53)
AST: 48 U/L — ABNORMAL HIGH (ref 0–37)
Albumin: 4.3 g/dL (ref 3.5–5.2)
Alkaline Phosphatase: 109 U/L (ref 39–117)
BILIRUBIN TOTAL: 0.4 mg/dL (ref 0.2–1.2)
BUN: 11 mg/dL (ref 6–23)
CO2: 24 meq/L (ref 19–32)
CREATININE: 1.02 mg/dL (ref 0.50–1.35)
Calcium: 9.2 mg/dL (ref 8.4–10.5)
Chloride: 101 mEq/L (ref 96–112)
Glucose, Bld: 101 mg/dL — ABNORMAL HIGH (ref 70–99)
Potassium: 4.7 mEq/L (ref 3.5–5.3)
Sodium: 135 mEq/L (ref 135–145)
Total Protein: 7.6 g/dL (ref 6.0–8.3)

## 2014-03-27 LAB — CBC WITH DIFFERENTIAL/PLATELET
BASOS ABS: 0 10*3/uL (ref 0.0–0.1)
Basophils Relative: 1 % (ref 0–1)
Eosinophils Absolute: 0.1 10*3/uL (ref 0.0–0.7)
Eosinophils Relative: 2 % (ref 0–5)
HCT: 45.7 % (ref 39.0–52.0)
Hemoglobin: 16.4 g/dL (ref 13.0–17.0)
LYMPHS PCT: 29 % (ref 12–46)
Lymphs Abs: 1.2 10*3/uL (ref 0.7–4.0)
MCH: 33.5 pg (ref 26.0–34.0)
MCHC: 35.9 g/dL (ref 30.0–36.0)
MCV: 93.5 fL (ref 78.0–100.0)
MONO ABS: 0.6 10*3/uL (ref 0.1–1.0)
MONOS PCT: 14 % — AB (ref 3–12)
NEUTROS ABS: 2.2 10*3/uL (ref 1.7–7.7)
Neutrophils Relative %: 54 % (ref 43–77)
Platelets: 260 10*3/uL (ref 150–400)
RBC: 4.89 MIL/uL (ref 4.22–5.81)
RDW: 13.5 % (ref 11.5–15.5)
WBC: 4 10*3/uL (ref 4.0–10.5)

## 2014-03-27 LAB — LIPID PANEL
Cholesterol: 229 mg/dL — ABNORMAL HIGH (ref 0–200)
HDL: 72 mg/dL (ref >39)
LDL CALC: 144 mg/dL — AB (ref 0–99)
TRIGLYCERIDES: 65 mg/dL (ref <150)
Total CHOL/HDL Ratio: 3.2 Ratio
VLDL: 13 mg/dL (ref 0–40)

## 2014-03-27 NOTE — Progress Notes (Signed)
   Subjective:    Patient ID: Scott Khan, male    DOB: 1961/02/07, 53 y.o.   MRN: [id number]  HPI    Review of Systems  Constitutional: Negative.   HENT: Negative.   Eyes: Negative.   Respiratory: Negative.   Cardiovascular: Negative.   Gastrointestinal: Negative.   Genitourinary: Negative.   Musculoskeletal: Negative.   Skin: Negative.   Neurological: Negative.   Psychiatric/Behavioral: Negative.        Objective:   Physical Exam  Constitutional: He is oriented to person, place, and time.  HENT:  Mouth/Throat: Oropharynx is clear and moist.  Eyes: No scleral icterus.  Neck: Normal range of motion.  Cardiovascular: Normal rate, regular rhythm, normal heart sounds and intact distal pulses.   Pulmonary/Chest: Effort normal. He has wheezes. He has no rales.  Abdominal: Soft. Bowel sounds are normal.  Musculoskeletal: Normal range of motion. He exhibits no edema.  Lymphadenopathy:    He has no cervical adenopathy.  Neurological: He is alert and oriented to person, place, and time.  Skin: Skin is warm and dry.  Psychiatric: He has a normal mood and affect.          Assessment & Plan:  Scott Khan is here to screen for the Reprieve Study. Informed consent was obtained after he read the consent and we discussed it in detail. He understands it is voluntary and that he will be expected to take a pill which may be placebo daily to evaluate it's effectiveness in the treatment of cardiovascular disease. He does smoke about 1 1/2 packs of cigarettes daily and drinks at least a six pack of beer daily. He denies any current problems. He did have some wheezes in the right lobes today on exam. He is planning on coming back next Wednesday to enroll if he is eligible.

## 2014-04-03 ENCOUNTER — Ambulatory Visit (INDEPENDENT_AMBULATORY_CARE_PROVIDER_SITE_OTHER): Payer: Self-pay | Admitting: *Deleted

## 2014-04-03 VITALS — BP 113/80 | HR 82 | Temp 98.7°F | Resp 16 | Wt 165.8 lb

## 2014-04-03 DIAGNOSIS — B20 Human immunodeficiency virus [HIV] disease: Secondary | ICD-10-CM

## 2014-04-03 DIAGNOSIS — Z006 Encounter for examination for normal comparison and control in clinical research program: Secondary | ICD-10-CM

## 2014-04-03 NOTE — Progress Notes (Signed)
Scott Khan enrolled on the Reprieve study today. He was randomized to pitavastatin 4mg /placebo daily. He was given 180 tablets from the pharmacist. He was instructed on dosing, what side effects to call for, etc. I did stress the importance of him calling us and stopping the medication for fever, rash, bad muscle aches or any significant symptom. He plans to start the medication this evening with dinner. He was also given the diet and exercise guide as part of the study. He denies any problems currently or new medications. He will return in 1 month for followup.

## 2014-04-04 ENCOUNTER — Other Ambulatory Visit: Payer: Self-pay | Admitting: *Deleted

## 2014-04-04 MED ORDER — PITAVASTATIN CALCIUM 4 MG PO TABS: 4.0000 mg | ORAL_TABLET | Freq: Every day | ORAL | Status: DC

## 2014-04-11 ENCOUNTER — Emergency Department (HOSPITAL_COMMUNITY): Payer: Self-pay

## 2014-04-11 ENCOUNTER — Encounter (HOSPITAL_COMMUNITY): Payer: Self-pay | Admitting: *Deleted

## 2014-04-11 ENCOUNTER — Emergency Department (HOSPITAL_COMMUNITY)
Admission: EM | Admit: 2014-04-11 | Discharge: 2014-04-11 | Disposition: A | Discharge status: Home / Self Care | Payer: Self-pay | Attending: Emergency Medicine | Admitting: Emergency Medicine

## 2014-04-11 DIAGNOSIS — S91311A Laceration without foreign body, right foot, initial encounter: Secondary | ICD-10-CM | POA: Insufficient documentation

## 2014-04-11 DIAGNOSIS — Y9289 Other specified places as the place of occurrence of the external cause: Secondary | ICD-10-CM | POA: Insufficient documentation

## 2014-04-11 DIAGNOSIS — Z79899 Other long term (current) drug therapy: Secondary | ICD-10-CM | POA: Insufficient documentation

## 2014-04-11 DIAGNOSIS — S0531XA Ocular laceration without prolapse or loss of intraocular tissue, right eye, initial encounter: Secondary | ICD-10-CM | POA: Insufficient documentation

## 2014-04-11 DIAGNOSIS — Z720 Tobacco use: Secondary | ICD-10-CM | POA: Insufficient documentation

## 2014-04-11 DIAGNOSIS — Y9389 Activity, other specified: Secondary | ICD-10-CM | POA: Insufficient documentation

## 2014-04-11 DIAGNOSIS — Y998 Other external cause status: Secondary | ICD-10-CM | POA: Insufficient documentation

## 2014-04-11 DIAGNOSIS — Z23 Encounter for immunization: Secondary | ICD-10-CM | POA: Insufficient documentation

## 2014-04-11 DIAGNOSIS — Z21 Asymptomatic human immunodeficiency virus [HIV] infection status: Secondary | ICD-10-CM | POA: Insufficient documentation

## 2014-04-11 MED ORDER — TETANUS-DIPHTH-ACELL PERTUSSIS 5-2.5-18.5 LF-MCG/0.5 IM SUSP
0.5000 mL | Freq: Once | INTRAMUSCULAR | Status: AC
  Administered 2014-04-11: 0.5 mL via INTRAMUSCULAR
  Filled 2014-04-11: qty 0.5

## 2014-04-11 MED ORDER — TETRACAINE HCL 0.5 % OP SOLN
1.0000 [drp] | Freq: Once | OPHTHALMIC | Status: AC
  Administered 2014-04-11: 1 [drp] via OPHTHALMIC
  Filled 2014-04-11: qty 2

## 2014-04-11 MED ORDER — IBUPROFEN 800 MG PO TABS
800.0000 mg | ORAL_TABLET | Freq: Once | ORAL | Status: AC
  Administered 2014-04-11: 800 mg via ORAL
  Filled 2014-04-11: qty 1

## 2014-04-11 MED ORDER — FLUORESCEIN SODIUM 1 MG OP STRP
1.0000 | ORAL_STRIP | Freq: Once | OPHTHALMIC | Status: AC
  Administered 2014-04-11: 1 via OPHTHALMIC
  Filled 2014-04-11: qty 1

## 2014-04-11 MED ORDER — LIDOCAINE HCL (PF) 1 % IJ SOLN
30.0000 mL | Freq: Once | INTRAMUSCULAR | Status: AC
  Administered 2014-04-11: 30 mL via INTRADERMAL
  Filled 2014-04-11: qty 30
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
## 2014-04-11 NOTE — ED Notes (Signed)
Pt requesting pain medication-PA aware. 
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
## 2014-04-11 NOTE — Discharge Instructions (Signed)
Keep sutures clean and dry. °Follow up with your primary care physician in 1 week for suture removal. °Return to the ED for new concerns. °

## 2014-04-19 ENCOUNTER — Emergency Department (HOSPITAL_COMMUNITY)
Admission: EM | Admit: 2014-04-19 | Discharge: 2014-04-19 | Disposition: A | Discharge status: Home / Self Care | Payer: Self-pay | Attending: Emergency Medicine | Admitting: Emergency Medicine

## 2014-04-19 ENCOUNTER — Encounter (HOSPITAL_COMMUNITY): Payer: Self-pay | Admitting: Family Medicine

## 2014-04-19 DIAGNOSIS — Z79899 Other long term (current) drug therapy: Secondary | ICD-10-CM | POA: Insufficient documentation

## 2014-04-19 DIAGNOSIS — Z4802 Encounter for removal of sutures: Secondary | ICD-10-CM

## 2014-04-19 DIAGNOSIS — Y9289 Other specified places as the place of occurrence of the external cause: Secondary | ICD-10-CM | POA: Insufficient documentation

## 2014-04-19 DIAGNOSIS — Z720 Tobacco use: Secondary | ICD-10-CM | POA: Insufficient documentation

## 2014-04-19 DIAGNOSIS — Y9389 Activity, other specified: Secondary | ICD-10-CM | POA: Insufficient documentation

## 2014-04-19 DIAGNOSIS — X58XXXD Exposure to other specified factors, subsequent encounter: Secondary | ICD-10-CM | POA: Insufficient documentation

## 2014-04-19 DIAGNOSIS — Y998 Other external cause status: Secondary | ICD-10-CM | POA: Insufficient documentation

## 2014-04-19 DIAGNOSIS — S91311D Laceration without foreign body, right foot, subsequent encounter: Secondary | ICD-10-CM | POA: Insufficient documentation

## 2014-04-19 DIAGNOSIS — Z21 Asymptomatic human immunodeficiency virus [HIV] infection status: Secondary | ICD-10-CM | POA: Insufficient documentation

## 2014-04-19 NOTE — ED Notes (Signed)
Pt here for suture removal in right foot.

## 2014-04-19 NOTE — ED Notes (Signed)
Sutures intact to right foot. Healing wound.

## 2014-04-19 NOTE — Discharge Instructions (Signed)

## 2014-04-19 NOTE — ED Provider Notes (Signed)
CSN: [id number]     Arrival date & time 04/19/14  1327 History  This chart was scribed for non-physician practitioner, Terri Piedraourtney Forcucci, PA-C,working with Gwyneth SproutWhitney Plunkett, MD, by Karle PlumberJennifer Tensley, ED Scribe. This patient was seen in room TR05C/TR05C and the patient's care was started at 2:18 PM.  Chief Complaint  Patient presents with  . Suture / Staple Removal   Patient is a 53 y.o. male presenting with suture removal. The history is provided by the patient. No language interpreter was used.  Suture / Staple Removal    HPI Comments:  Scott Khan is a 53 y.o. male with PMH of HIV who presents to the Emergency Department needing 6 bsutures removed from his right great toe that were placed 8 days ago. He reports some mild bleeding from the wound at first but denies any further drainage or issue. He denies fever, chills, nausea or vomiting.   Past Medical History  Diagnosis Date  . HIV (human immunodeficiency virus infection)    Past Surgical History  Procedure Laterality Date  . Patella arthroplasty     History reviewed. No pertinent family history. History  Substance Use Topics  . Smoking status: Current Every Day Smoker -- 1.50 packs/day for 30 years    Types: Cigarettes  . Smokeless tobacco: Never Used     Comment: has not been able to cut back  . Alcohol Use: 21.0 oz/week    42 drink(s) per week     Comment: a six pack of beer a day    Review of Systems  Constitutional: Negative for fever and chills.  Gastrointestinal: Negative for nausea and vomiting.  Skin: Positive for wound.  All other systems reviewed and are negative.   Allergies  Review of patient's allergies indicates no known allergies.  Home Medications   Prior to Admission medications   Medication Sig Start Date End Date Taking? Authorizing Provider  ATRIPLA 600-200-300 MG per tablet TAKE 1 TABLET BY MOUTH DAILY    Gardiner Barefootobert W Comer, MD  Pitavastatin Calcium 4 MG TABS Take 1 tablet (4 mg total) by  mouth daily. 04/04/14   Gardiner Barefootobert W Comer, MD   Triage Vitals: BP 139/85 mmHg  Pulse 105  Temp(Src) 98 F (36.7 C)  Resp 18  SpO2 96% Physical Exam  Constitutional: He is oriented to person, place, and time. He appears well-developed and well-nourished.  HENT:  Head: Normocephalic and atraumatic.  Eyes: EOM are normal.  Neck: Normal range of motion.  Cardiovascular: Normal rate.   Pulmonary/Chest: Effort normal.  Musculoskeletal: Normal range of motion.  Neurological: He is alert and oriented to person, place, and time.  Skin: Skin is warm and dry.  Well healing crescent shaped wound with six sutures on the base of the right great toe. No erythema, edema or drainage.  Psychiatric: He has a normal mood and affect. His behavior is normal.  Nursing note and vitals reviewed.   ED Course  SUTURE REMOVAL Date/Time: 04/19/2014 2:28 PM Performed by: Terri PiedraFORCUCCI, Herve Haug A Authorized by: Terri PiedraFORCUCCI, Inocencia Murtaugh A Consent: Verbal consent obtained. Risks and benefits: risks, benefits and alternatives were discussed Consent given by: patient Patient understanding: patient states understanding of the procedure being performed Patient consent: the patient's understanding of the procedure matches consent given Procedure consent: procedure consent matches procedure scheduled Relevant documents: relevant documents present and verified Test results: test results available and properly labeled Site marked: the operative site was marked Imaging studies: imaging studies available Patient identity confirmed: verbally with patient Time  out: Immediately prior to procedure a "time out" was called to verify the correct patient, procedure, equipment, support staff and site/side marked as required. Body area: lower extremity Location details: right foot Wound Appearance: clean Sutures Removed: 6 Post-removal: dressing applied Facility: sutures placed in this facility Patient tolerance: Patient tolerated the  procedure well with no immediate complications   (including critical care time) DIAGNOSTIC STUDIES: Oxygen Saturation is 96% on RA, normal by my interpretation.   COORDINATION OF CARE: 2:21 PM- Will remove sutures and place bandage. Return precautions discussed. Pt verbalizes understanding and agrees to plan.  Medications - No data to display  Labs Review Labs Reviewed - No data to display  Imaging Review No results found.   EKG Interpretation None      MDM   Final diagnoses:  Foot laceration, right, subsequent encounter  Visit for suture removal   Patient is a 53 year old male who returns for suture removal from wound on right great toe. Wound appears to be healing well at this time. 6 sutures were removed as seen above. Patient has stable vital signs. Patient told to return for signs of infection. Patient to follow-up with his PCP for further care if needed. Patient is stable for discharge at this time.  I personally performed the services described in this documentation, which was scribed in my presence. The recorded information has been reviewed and is accurate.    Eben Burowourtney A Forcucci, PA-C 04/19/14 1429  Gwyneth SproutWhitney Plunkett, MD 04/19/14 [phone]

## 2014-05-01 ENCOUNTER — Ambulatory Visit (INDEPENDENT_AMBULATORY_CARE_PROVIDER_SITE_OTHER): Payer: Self-pay | Admitting: *Deleted

## 2014-05-01 VITALS — BP 122/84 | HR 89 | Temp 98.4°F | Resp 16 | Wt 161.5 lb

## 2014-05-01 DIAGNOSIS — Z006 Encounter for examination for normal comparison and control in clinical research program: Secondary | ICD-10-CM

## 2014-05-01 DIAGNOSIS — B20 Human immunodeficiency virus [HIV] disease: Secondary | ICD-10-CM

## 2014-05-01 LAB — COMPREHENSIVE METABOLIC PANEL
ALK PHOS: 105 U/L (ref 39–117)
ALT: 30 U/L (ref 0–53)
AST: 38 U/L — ABNORMAL HIGH (ref 0–37)
Albumin: 4.3 g/dL (ref 3.5–5.2)
BILIRUBIN TOTAL: 0.4 mg/dL (ref 0.2–1.2)
BUN: 9 mg/dL (ref 6–23)
CO2: 26 mEq/L (ref 19–32)
CREATININE: 0.93 mg/dL (ref 0.50–1.35)
Calcium: 9.6 mg/dL (ref 8.4–10.5)
Chloride: 102 mEq/L (ref 96–112)
GLUCOSE: 103 mg/dL — AB (ref 70–99)
Potassium: 4.5 mEq/L (ref 3.5–5.3)
SODIUM: 136 meq/L (ref 135–145)
TOTAL PROTEIN: 7.7 g/dL (ref 6.0–8.3)

## 2014-05-01 NOTE — Progress Notes (Signed)
Scott Khan is here for his 1 month Reprieve study visit. He says he cut his rt foot on glass and came to the Ed on 11/12 and it has healed up now. He says he has a ache in his lt inner thigh where he thinks he pulled a muscle but no generalized muscle aches or weakness. He will return in 3 months to see Dr. Luciana Axeomer and for research

## 2014-07-09 ENCOUNTER — Other Ambulatory Visit (INDEPENDENT_AMBULATORY_CARE_PROVIDER_SITE_OTHER): Payer: Self-pay

## 2014-07-09 DIAGNOSIS — Z79899 Other long term (current) drug therapy: Secondary | ICD-10-CM

## 2014-07-09 DIAGNOSIS — Z113 Encounter for screening for infections with a predominantly sexual mode of transmission: Secondary | ICD-10-CM

## 2014-07-09 DIAGNOSIS — B20 Human immunodeficiency virus [HIV] disease: Secondary | ICD-10-CM

## 2014-07-09 LAB — LIPID PANEL
CHOLESTEROL: 220 mg/dL — AB (ref 0–200)
HDL: 75 mg/dL (ref >39)
LDL Cholesterol: 125 mg/dL — ABNORMAL HIGH (ref 0–99)
Total CHOL/HDL Ratio: 2.9 Ratio
Triglycerides: 100 mg/dL (ref <150)
VLDL: 20 mg/dL (ref 0–40)

## 2014-07-09 LAB — CBC WITH DIFFERENTIAL/PLATELET
Basophils Absolute: 0 10*3/uL (ref 0.0–0.1)
Basophils Relative: 0 % (ref 0–1)
EOS PCT: 1 % (ref 0–5)
Eosinophils Absolute: 0.1 10*3/uL (ref 0.0–0.7)
HEMATOCRIT: 47.8 % (ref 39.0–52.0)
HEMOGLOBIN: 16.9 g/dL (ref 13.0–17.0)
LYMPHS PCT: 18 % (ref 12–46)
Lymphs Abs: 1 10*3/uL (ref 0.7–4.0)
MCH: 34 pg (ref 26.0–34.0)
MCHC: 35.4 g/dL (ref 30.0–36.0)
MCV: 96.2 fL (ref 78.0–100.0)
MONOS PCT: 12 % (ref 3–12)
MPV: 8.6 fL (ref 8.6–12.4)
Monocytes Absolute: 0.7 10*3/uL (ref 0.1–1.0)
NEUTROS ABS: 4 10*3/uL (ref 1.7–7.7)
Neutrophils Relative %: 69 % (ref 43–77)
Platelets: 255 10*3/uL (ref 150–400)
RBC: 4.97 MIL/uL (ref 4.22–5.81)
RDW: 13.2 % (ref 11.5–15.5)
WBC: 5.8 10*3/uL (ref 4.0–10.5)

## 2014-07-09 LAB — COMPLETE METABOLIC PANEL WITH GFR
ALT: 31 U/L (ref 0–53)
AST: 38 U/L — ABNORMAL HIGH (ref 0–37)
Albumin: 4.2 g/dL (ref 3.5–5.2)
Alkaline Phosphatase: 101 U/L (ref 39–117)
BILIRUBIN TOTAL: 0.4 mg/dL (ref 0.2–1.2)
BUN: 13 mg/dL (ref 6–23)
CO2: 25 meq/L (ref 19–32)
Calcium: 9.6 mg/dL (ref 8.4–10.5)
Chloride: 99 mEq/L (ref 96–112)
Creat: 0.94 mg/dL (ref 0.50–1.35)
GFR, Est African American: >89 mL/min
GFR, Est Non African American: >89 mL/min
GLUCOSE: 93 mg/dL (ref 70–99)
Potassium: 5.2 mEq/L (ref 3.5–5.3)
SODIUM: 134 meq/L — AB (ref 135–145)
Total Protein: 7.5 g/dL (ref 6.0–8.3)

## 2014-07-09 LAB — RPR

## 2014-07-10 LAB — T-HELPER CELL (CD4) - (RCID CLINIC ONLY)
CD4 % Helper T Cell: 40 % (ref 33–55)
CD4 T Cell Abs: 410 /uL (ref 400–2700)

## 2014-07-10 LAB — HIV-1 RNA QUANT-NO REFLEX-BLD
HIV 1 RNA Quant: 27 copies/mL — ABNORMAL HIGH (ref <20)
HIV-1 RNA QUANT, LOG: 1.43 {Log} — AB (ref <1.30)

## 2014-07-23 ENCOUNTER — Ambulatory Visit: Payer: Self-pay | Admitting: Internal Medicine

## 2014-07-25 ENCOUNTER — Encounter: Payer: Self-pay | Admitting: Internal Medicine

## 2014-07-25 ENCOUNTER — Ambulatory Visit (INDEPENDENT_AMBULATORY_CARE_PROVIDER_SITE_OTHER): Payer: Self-pay | Admitting: *Deleted

## 2014-07-25 ENCOUNTER — Ambulatory Visit (INDEPENDENT_AMBULATORY_CARE_PROVIDER_SITE_OTHER): Payer: Self-pay | Admitting: Internal Medicine

## 2014-07-25 VITALS — BP 113/75 | HR 82 | Temp 98.0°F | Ht 74.0 in | Wt 167.5 lb

## 2014-07-25 DIAGNOSIS — B20 Human immunodeficiency virus [HIV] disease: Secondary | ICD-10-CM

## 2014-07-25 DIAGNOSIS — Z006 Encounter for examination for normal comparison and control in clinical research program: Secondary | ICD-10-CM

## 2014-07-25 NOTE — Progress Notes (Signed)
Scott Khan is here for A5332 study, month 4. He denies any new problems, medications, or symptoms. He denies any muscle aches or weakness. Pill count was performed and has 80 pills left. Dispensed 90 day supply of study drug at this visit. He received 2 bus passes and $50 gift card for visit. Next appointment scheduled for Tuesday, November 20, 2014 @ 9:30am Tacey HeapElisha Kaydyn Chism RN

## 2014-07-25 NOTE — Assessment & Plan Note (Signed)
He is doing well on his regimen and will continue. He can return in 4 months with labs prior to the appointment.

## 2014-07-25 NOTE — Progress Notes (Signed)
  Subjective:    Patient ID: Scott Khan, male    DOB: [birth year], 54 y.o.   MRN: [id number]  HPI He comes in for followup of his HIV.  He continues with excellent compliance and no missed doses.  Labs good with CD4 of 410 and nearly undetectable vl.  Still struggles with smoking. No weight loss, no diarrhea. In the research protocol of pitavastatin/placebo.    Review of Systems  Constitutional: Negative for fever and unexpected weight change.  HENT: Negative for trouble swallowing.   Eyes: Negative for visual disturbance.  Respiratory: Negative for shortness of breath.   Cardiovascular: Negative for chest pain.  Gastrointestinal: Negative for abdominal pain, diarrhea and constipation.  Musculoskeletal: Negative for joint swelling.  Skin: Negative for rash.  Neurological: Negative for dizziness, light-headedness and headaches.  Hematological: Negative for adenopathy.  Psychiatric/Behavioral: Negative for dysphoric mood. The patient is not nervous/anxious.        Objective:   Physical Exam  Constitutional: He appears well-developed and well-nourished. No distress.  HENT:  Mouth/Throat: No oropharyngeal exudate.  Eyes: Right eye exhibits no discharge. Left eye exhibits no discharge. No scleral icterus.  Cardiovascular: Normal rate, regular rhythm and normal heart sounds.   No murmur heard. Pulmonary/Chest: Effort normal and breath sounds normal. No respiratory distress. He has no wheezes.  Lymphadenopathy:    He has no cervical adenopathy.  Skin: No rash noted.          Assessment & Plan:

## 2014-11-20 ENCOUNTER — Other Ambulatory Visit: Payer: Self-pay

## 2014-12-05 ENCOUNTER — Ambulatory Visit: Payer: Self-pay | Admitting: Internal Medicine

## 2015-09-08 ENCOUNTER — Other Ambulatory Visit: Payer: Self-pay

## 2015-09-11 ENCOUNTER — Other Ambulatory Visit: Payer: Self-pay

## 2015-09-25 ENCOUNTER — Other Ambulatory Visit (INDEPENDENT_AMBULATORY_CARE_PROVIDER_SITE_OTHER): Payer: Self-pay

## 2015-09-25 ENCOUNTER — Ambulatory Visit (INDEPENDENT_AMBULATORY_CARE_PROVIDER_SITE_OTHER): Payer: Self-pay | Admitting: Internal Medicine

## 2015-09-25 ENCOUNTER — Other Ambulatory Visit: Payer: Self-pay | Admitting: *Deleted

## 2015-09-25 DIAGNOSIS — Z113 Encounter for screening for infections with a predominantly sexual mode of transmission: Secondary | ICD-10-CM

## 2015-09-25 DIAGNOSIS — Z79899 Other long term (current) drug therapy: Secondary | ICD-10-CM

## 2015-09-25 DIAGNOSIS — B20 Human immunodeficiency virus [HIV] disease: Secondary | ICD-10-CM

## 2015-09-25 LAB — CBC WITH DIFFERENTIAL/PLATELET
BASOS PCT: 0 %
Basophils Absolute: 0 cells/uL (ref 0–200)
EOS ABS: 48 {cells}/uL (ref 15–500)
Eosinophils Relative: 1 %
HCT: 44.1 % (ref 38.5–50.0)
Hemoglobin: 15 g/dL (ref 13.2–17.1)
LYMPHS ABS: 1056 {cells}/uL (ref 850–3900)
Lymphocytes Relative: 22 %
MCH: 32.1 pg (ref 27.0–33.0)
MCHC: 34 g/dL (ref 32.0–36.0)
MCV: 94.2 fL (ref 80.0–100.0)
MONO ABS: 480 {cells}/uL (ref 200–950)
MPV: 8.8 fL (ref 7.5–12.5)
Monocytes Relative: 10 %
NEUTROS ABS: 3216 {cells}/uL (ref 1500–7800)
Neutrophils Relative %: 67 %
Platelets: 264 10*3/uL (ref 140–400)
RBC: 4.68 MIL/uL (ref 4.20–5.80)
RDW: 14.2 % (ref 11.0–15.0)
WBC: 4.8 10*3/uL (ref 3.8–10.8)

## 2015-09-25 LAB — COMPLETE METABOLIC PANEL WITH GFR
ALT: 21 U/L (ref 9–46)
AST: 25 U/L (ref 10–35)
Albumin: 4.2 g/dL (ref 3.6–5.1)
Alkaline Phosphatase: 111 U/L (ref 40–115)
BUN: 11 mg/dL (ref 7–25)
CHLORIDE: 100 mmol/L (ref 98–110)
CO2: 24 mmol/L (ref 20–31)
Calcium: 9.3 mg/dL (ref 8.6–10.3)
Creat: 1.07 mg/dL (ref 0.70–1.33)
GFR, EST NON AFRICAN AMERICAN: 79 mL/min (ref >=60)
GFR, Est African American: >89 mL/min (ref >=60)
Glucose, Bld: 82 mg/dL (ref 65–99)
POTASSIUM: 4.6 mmol/L (ref 3.5–5.3)
Sodium: 136 mmol/L (ref 135–146)
Total Bilirubin: 0.3 mg/dL (ref 0.2–1.2)
Total Protein: 7.5 g/dL (ref 6.1–8.1)

## 2015-09-25 LAB — LIPID PANEL
CHOLESTEROL: 223 mg/dL — AB (ref 125–200)
HDL: 70 mg/dL (ref >=40)
LDL CALC: 135 mg/dL — AB (ref <130)
Total CHOL/HDL Ratio: 3.2 Ratio (ref <=5.0)
Triglycerides: 92 mg/dL (ref <150)
VLDL: 18 mg/dL (ref <30)

## 2015-09-25 NOTE — Progress Notes (Signed)
Arrived late, labs only

## 2015-09-26 LAB — URINE CYTOLOGY ANCILLARY ONLY
CHLAMYDIA, DNA PROBE: NEGATIVE
Neisseria Gonorrhea: NEGATIVE

## 2015-09-26 LAB — T-HELPER CELL (CD4) - (RCID CLINIC ONLY)
CD4 T CELL ABS: 440 /uL (ref 400–2700)
CD4 T CELL HELPER: 42 % (ref 33–55)

## 2015-09-26 LAB — HIV-1 RNA QUANT-NO REFLEX-BLD: HIV 1 RNA Quant: <20 copies/mL (ref <20)

## 2015-09-26 LAB — RPR

## 2015-10-22 ENCOUNTER — Ambulatory Visit (INDEPENDENT_AMBULATORY_CARE_PROVIDER_SITE_OTHER): Payer: Self-pay | Admitting: Internal Medicine

## 2015-10-22 ENCOUNTER — Other Ambulatory Visit: Payer: Self-pay | Admitting: Internal Medicine

## 2015-10-22 ENCOUNTER — Encounter: Payer: Self-pay | Admitting: Internal Medicine

## 2015-10-22 VITALS — BP 125/79 | HR 96 | Temp 99.0°F | Ht 74.0 in | Wt 175.0 lb

## 2015-10-22 DIAGNOSIS — E785 Hyperlipidemia, unspecified: Secondary | ICD-10-CM

## 2015-10-22 DIAGNOSIS — R7401 Elevation of levels of liver transaminase levels: Secondary | ICD-10-CM

## 2015-10-22 DIAGNOSIS — R740 Nonspecific elevation of levels of transaminase and lactic acid dehydrogenase [LDH]: Secondary | ICD-10-CM

## 2015-10-22 DIAGNOSIS — B20 Human immunodeficiency virus [HIV] disease: Secondary | ICD-10-CM

## 2015-10-22 NOTE — Assessment & Plan Note (Signed)
Mild elevation of LDL but no indication for treatment.

## 2015-10-22 NOTE — Assessment & Plan Note (Signed)
Resolved now. 

## 2015-10-22 NOTE — Assessment & Plan Note (Signed)
Doing good, awaiting ADAP approval but has medication.  [facility] once his [facility] is assured.

## 2015-10-22 NOTE — Progress Notes (Signed)
CC: Follow up for HIV  Interval history: Currently is asymptomatic and well-controlled on Atripla.  He last came to clinic about one month ago for labs and had been on Atripla without any interruption.  He has no complaints now.  Since last visit he has remained undetectable.  Has no associated CNS side effects.  Denies any missed doses.      Prior to Admission medications   Medication Sig Start Date End Date Taking? Authorizing Provider  ATRIPLA 600-200-300 MG per tablet TAKE 1 TABLET BY MOUTH DAILY   Yes Gardiner Barefootobert W Comer, MD  Pitavastatin Calcium 4 MG TABS Take 1 tablet (4 mg total) by mouth daily. Patient not taking: Reported on 10/22/2015 04/04/14   Gardiner Barefootobert W Comer, MD    Review of Systems Gastrointestinal: negative for diarrhea Musculoskeletal: negative for myalgias and arthralgias All other systems reviewed and are negative   Physical Exam: CONSTITUTIONAL:in no apparent distress and alert  Filed Vitals:   10/22/15 1610  BP: 125/79  Pulse: 96  Temp: 99 F (37.2 C)   Eyes: anicteric HENT: no thrush, no cervical lymphadenopathy Respiratory: Normal respiratory effort; CTA B Cardiovascular: RRR  Lab Results  Component Value Date   HIV1RNAQUANT <20 09/25/2015   HIV1RNAQUANT 27* 07/09/2014   HIV1RNAQUANT <20 03/07/2014   No components found for: HIV1GENOTYPRPLUS No components found for: THELPERCELL

## 2015-10-31 ENCOUNTER — Encounter: Payer: Self-pay | Admitting: Internal Medicine

## 2015-11-21 ENCOUNTER — Other Ambulatory Visit: Payer: Self-pay | Admitting: Internal Medicine

## 2015-11-21 DIAGNOSIS — B20 Human immunodeficiency virus [HIV] disease: Secondary | ICD-10-CM

## 2015-12-31 ENCOUNTER — Encounter: Payer: Self-pay | Admitting: Internal Medicine

## 2016-01-08 ENCOUNTER — Other Ambulatory Visit (INDEPENDENT_AMBULATORY_CARE_PROVIDER_SITE_OTHER): Payer: Self-pay

## 2016-01-08 DIAGNOSIS — B20 Human immunodeficiency virus [HIV] disease: Secondary | ICD-10-CM

## 2016-01-08 LAB — COMPLETE METABOLIC PANEL WITH GFR
ALT: 25 U/L (ref 9–46)
AST: 33 U/L (ref 10–35)
Albumin: 4.1 g/dL (ref 3.6–5.1)
Alkaline Phosphatase: 95 U/L (ref 40–115)
BUN: 5 mg/dL — ABNORMAL LOW (ref 7–25)
CALCIUM: 9.2 mg/dL (ref 8.6–10.3)
CHLORIDE: 100 mmol/L (ref 98–110)
CO2: 24 mmol/L (ref 20–31)
CREATININE: 0.95 mg/dL (ref 0.70–1.33)
Glucose, Bld: 87 mg/dL (ref 65–99)
Potassium: 4.6 mmol/L (ref 3.5–5.3)
Sodium: 135 mmol/L (ref 135–146)
Total Bilirubin: 0.4 mg/dL (ref 0.2–1.2)
Total Protein: 7.3 g/dL (ref 6.1–8.1)

## 2016-01-08 LAB — CBC WITH DIFFERENTIAL/PLATELET
BASOS PCT: 0 %
Basophils Absolute: 0 cells/uL (ref 0–200)
Eosinophils Absolute: 116 cells/uL (ref 15–500)
Eosinophils Relative: 2 %
HCT: 47.4 % (ref 38.5–50.0)
HEMOGLOBIN: 16.7 g/dL (ref 13.2–17.1)
LYMPHS PCT: 22 %
Lymphs Abs: 1276 cells/uL (ref 850–3900)
MCH: 33.7 pg — AB (ref 27.0–33.0)
MCHC: 35.2 g/dL (ref 32.0–36.0)
MCV: 95.8 fL (ref 80.0–100.0)
MONO ABS: 754 {cells}/uL (ref 200–950)
MONOS PCT: 13 %
MPV: 8.8 fL (ref 7.5–12.5)
NEUTROS ABS: 3654 {cells}/uL (ref 1500–7800)
Neutrophils Relative %: 63 %
PLATELETS: 197 10*3/uL (ref 140–400)
RBC: 4.95 MIL/uL (ref 4.20–5.80)
RDW: 13.5 % (ref 11.0–15.0)
WBC: 5.8 10*3/uL (ref 3.8–10.8)

## 2016-01-09 LAB — T-HELPER CELL (CD4) - (RCID CLINIC ONLY)
CD4 % Helper T Cell: 30 % — ABNORMAL LOW (ref 33–55)
CD4 T Cell Abs: 410 /uL (ref 400–2700)

## 2016-01-12 LAB — HIV-1 RNA QUANT-NO REFLEX-BLD
HIV 1 RNA QUANT: 21 {copies}/mL (ref <20)
HIV-1 RNA Quant, Log: 1.32 Log copies/mL — ABNORMAL HIGH (ref <1.30)

## 2016-01-22 ENCOUNTER — Ambulatory Visit: Payer: Self-pay | Admitting: Internal Medicine

## 2016-01-26 ENCOUNTER — Ambulatory Visit: Payer: Self-pay | Admitting: Internal Medicine

## 2016-04-03 ENCOUNTER — Other Ambulatory Visit: Payer: Self-pay | Admitting: Internal Medicine

## 2016-04-03 DIAGNOSIS — B20 Human immunodeficiency virus [HIV] disease: Secondary | ICD-10-CM

## 2016-06-02 ENCOUNTER — Other Ambulatory Visit: Payer: Self-pay | Admitting: Internal Medicine

## 2016-06-02 DIAGNOSIS — B20 Human immunodeficiency virus [HIV] disease: Secondary | ICD-10-CM

## 2016-07-01 ENCOUNTER — Emergency Department (HOSPITAL_COMMUNITY)
Admission: EM | Admit: 2016-07-01 | Discharge: 2016-07-01 | Disposition: A | Discharge status: Home / Self Care | Payer: Self-pay | Attending: Emergency Medicine | Admitting: Emergency Medicine

## 2016-07-01 ENCOUNTER — Emergency Department (HOSPITAL_COMMUNITY): Payer: Self-pay

## 2016-07-01 ENCOUNTER — Encounter (HOSPITAL_COMMUNITY): Payer: Self-pay

## 2016-07-01 DIAGNOSIS — IMO0002 Reserved for concepts with insufficient information to code with codable children: Secondary | ICD-10-CM

## 2016-07-01 DIAGNOSIS — N451 Epididymitis: Secondary | ICD-10-CM

## 2016-07-01 DIAGNOSIS — F17210 Nicotine dependence, cigarettes, uncomplicated: Secondary | ICD-10-CM | POA: Insufficient documentation

## 2016-07-01 DIAGNOSIS — R229 Localized swelling, mass and lump, unspecified: Secondary | ICD-10-CM

## 2016-07-01 LAB — URINALYSIS, ROUTINE W REFLEX MICROSCOPIC
Bilirubin Urine: NEGATIVE
GLUCOSE, UA: NEGATIVE mg/dL
HGB URINE DIPSTICK: NEGATIVE
Ketones, ur: NEGATIVE mg/dL
Leukocytes, UA: NEGATIVE
Nitrite: NEGATIVE
PH: 5 (ref 5.0–8.0)
Protein, ur: NEGATIVE mg/dL
SPECIFIC GRAVITY, URINE: 1.012 (ref 1.005–1.030)

## 2016-07-01 MED ORDER — CEFTRIAXONE SODIUM 250 MG IJ SOLR
250.0000 mg | Freq: Once | INTRAMUSCULAR | Status: AC
  Administered 2016-07-01: 250 mg via INTRAMUSCULAR
  Filled 2016-07-01: qty 250

## 2016-07-01 MED ORDER — LEVOFLOXACIN 500 MG PO TABS: 500.0000 mg | ORAL_TABLET | Freq: Every day | ORAL | 0 refills | Status: DC

## 2016-07-01 MED ORDER — LIDOCAINE HCL (PF) 1 % IJ SOLN
INTRAMUSCULAR | Status: AC
  Administered 2016-07-01: 5 mL
  Filled 2016-07-01: qty 5

## 2016-07-01 NOTE — ED Notes (Signed)
Changed O2 sensor cord so it now reads

## 2016-07-01 NOTE — Discharge Instructions (Signed)
Evidence of probably infection in the epididymis. Take antibiotic as directed. Follow-up with urology. Also probably have a hydrocele repair which is a type of fluid hernia. Also recommend following back up with your infectious disease doctor and primary care doctor as needed. Return for any new or worse symptoms.

## 2016-07-01 NOTE — ED Notes (Signed)
Papers reviewed with patient and he verbalizes understanding. MD reviewed DC instructions with patient as well.

## 2016-07-01 NOTE — ED Provider Notes (Signed)
[facility]-EMERGENCY DEPT Provider Note   CSN: [id number] Arrival date & time: 07/01/16  0935     History   Chief Complaint Chief Complaint  Patient presents with  . Groin Swelling    HPI Scott Khan is a 56 y.o. male.  The patient is HIV-positive is taking his antivirals. Followed by infectious disease. Last seen by them several months ago was supposed to have follow-up in August but did not have an appointment. Patient states that he has had left-sided testicular swelling for 2 weeks. With some discomfort but urinating fine. No lesions no discharge. No history of injury. No similar history. No nausea no vomiting no abdominal pain.      Past Medical History:  Diagnosis Date  . HIV (human immunodeficiency virus infection) [facility])     Patient Active Problem List   Diagnosis Date Noted  . Encounter for long-term (current) use of medications 03/21/2014  . Routine screening for STI (sexually transmitted infection) 03/21/2014  . 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 Hyperlipidemia 03/20/2012  . Smoking 03/20/2012  . Transaminitis 11/10/2010  . Human immunodeficiency virus (HIV) disease (HCC) 07/14/2010    Past Surgical History:  Procedure Laterality Date  . PATELLA ARTHROPLASTY         Home Medications    Prior to Admission medications   Medication Sig Start Date End Date Taking? Authorizing Provider  ATRIPLA 600-200-300 MG tablet TAKE 1 TABLET BY MOUTH DAILY 06/02/16   Gardiner Barefoot, MD  levofloxacin (LEVAQUIN) 500 MG tablet Take 1 tablet (500 mg total) by mouth daily. 07/01/16   Vanetta Mulders, MD    Family History No family history on file.  Social History Social History  Substance Use Topics  . Smoking status: Current Every Day Smoker    Packs/day: 1.50    Years: 30.00    Types: Cigarettes  . Smokeless tobacco: Never Used     Comment: has not been able to cut back  . Alcohol use 25.2 oz/week    42 Standard drinks or equivalent per week     Comment: a six  pack of beer a day     Allergies   Patient has no known allergies.   Review of Systems Review of Systems  Constitutional: Negative for fever.  HENT: Negative for congestion.   Respiratory: Negative for shortness of breath.   Cardiovascular: Negative for chest pain.  Gastrointestinal: Negative for abdominal pain, nausea and vomiting.  Genitourinary: Positive for scrotal swelling and testicular pain. Negative for difficulty urinating, discharge, dysuria and genital sores.  Musculoskeletal: Negative for back pain.  Skin: Negative for rash and wound.  Neurological: Negative for headaches.  Hematological: Does not bruise/bleed easily.  Psychiatric/Behavioral: Negative for confusion.     Physical Exam Updated Vital Signs BP 128/96   Pulse 83   Temp 98.6 F (37 C) (Oral)   Resp 16   Ht 6\' 2"  (1.88 m)   Wt 79.4 kg   SpO2 95%   BMI 22.47 kg/m   Physical Exam  Constitutional: He is oriented to person, place, and time. He appears well-developed and well-nourished. No distress.  HENT:  Head: Normocephalic and atraumatic.  Mouth/Throat: Oropharynx is clear and moist.  Eyes: EOM are normal. Pupils are equal, round, and reactive to light.  Neck: Normal range of motion. Neck supple.  Cardiovascular: Normal rate, regular rhythm and normal heart sounds.   Pulmonary/Chest: Effort normal and breath sounds normal. No respiratory distress.  Abdominal: Soft. Bowel sounds are normal.  Genitourinary:  Penis normal.  Genitourinary Comments: No groin adenopathy. Patient is circumcised. No urethral discharge. Right testicle normal. Left testicle hard swollen. No evidence of any hernia. No skin lesions.  Musculoskeletal: Normal range of motion.  Neurological: He is alert and oriented to person, place, and time. No cranial nerve deficit or sensory deficit. He exhibits normal muscle tone. Coordination normal.  Skin: Skin is warm.  Nursing note and vitals reviewed.    ED Treatments / Results    Labs (all labs ordered are listed, but only abnormal results are displayed) Labs Reviewed  URINALYSIS, ROUTINE W REFLEX MICROSCOPIC    EKG  EKG Interpretation None       Radiology US Scrotum  Result Date: 07/01/2016 CLINICAL DATA:  Testicular torsion, testicular mass.  HIV EXAM: SCROTAL ULTRASOUND DOPPLER ULTRASOUND OF THE TESTICLES TECHNIQUE: Complete ultrasound examination of the testicles, epididymis, and other scrotal structures was performed. Color and spectral Doppler ultrasound were also utilized to evaluate blood flow to the testicles. COMPARISON:  None. FINDINGS: Right testicle Measurements: 3.9 x 2.3 x 3.2 cm. No mass or microlithiasis visualized. Left testicle Measurements: 3.9 x 2.5 x 3.5 cm. No mass or microlithiasis visualized. Right epididymis:  Normal in size and appearance. Left epididymis:  Normal in size and appearance. Hydrocele:  Complex hydrocele on the left with multiple loculations. Varicocele:  None visualized. Pulsed Doppler interrogation of both testes demonstrates no evidence of testicular torsion. Increased vascularity to the left testicle and epididymis suggesting epididymal orchitis. IMPRESSION: Negative for testicular torsion Hypervascularity to the left epididymis and left testicle suggesting infection. Complex hydrocele around the left testicle which may be due to acute or chronic infection. Electronically Signed   By: Marlan Palau M.D.   On: 07/01/2016 11:29   [place] Art/ven Flow Abd Pelv Doppler  Result Date: 07/01/2016 CLINICAL DATA:  Testicular torsion, testicular mass.  HIV EXAM: SCROTAL ULTRASOUND DOPPLER ULTRASOUND OF THE TESTICLES TECHNIQUE: Complete ultrasound examination of the testicles, epididymis, and other scrotal structures was performed. Color and spectral Doppler ultrasound were also utilized to evaluate blood flow to the testicles. COMPARISON:  None. FINDINGS: Right testicle Measurements: 3.9 x 2.3 x 3.2 cm. No mass or microlithiasis visualized.  Left testicle Measurements: 3.9 x 2.5 x 3.5 cm. No mass or microlithiasis visualized. Right epididymis:  Normal in size and appearance. Left epididymis:  Normal in size and appearance. Hydrocele:  Complex hydrocele on the left with multiple loculations. Varicocele:  None visualized. Pulsed Doppler interrogation of both testes demonstrates no evidence of testicular torsion. Increased vascularity to the left testicle and epididymis suggesting epididymal orchitis. IMPRESSION: Negative for testicular torsion Hypervascularity to the left epididymis and left testicle suggesting infection. Complex hydrocele around the left testicle which may be due to acute or chronic infection. Electronically Signed   By: Marlan Palau M.D.   On: 07/01/2016 11:29    Procedures Procedures (including critical care time)  Medications Ordered in ED Medications  cefTRIAXone (ROCEPHIN) injection 250 mg (not administered)     Initial Impression / Assessment and Plan / ED Course  I have reviewed the triage vital signs and the nursing notes.  Pertinent labs & imaging results that were available during my care of the patient were reviewed by me and considered in my medical decision making (see chart for details).     Left-sided testicular swelling and hardness. Ultrasound shows no evidence of torsion or mass but there is evidence of a hydrocele and chronic inflammation or acute inflammation consistent perhaps with orchitis epididymitis. Patient  received Rocephin here 250 mg IM. And will be continued on Levaquin for the next 10 days. Referral to urology for follow-up and as well recommend follow-up back with his infectious disease specialist. Recommend Motrin or Naprosyn for the discomfort.   Final Clinical Impressions(s) / ED Diagnoses   Final diagnoses:  Mass  Epididymitis    New Prescriptions New Prescriptions   LEVOFLOXACIN (LEVAQUIN) 500 MG TABLET    Take 1 tablet (500 mg total) by mouth daily.     Vanetta MuldersScott  Domanic Matusek, MD 07/01/16 1220

## 2016-07-01 NOTE — ED Triage Notes (Signed)
Pt. sts that he noticed swelling in his testicles that started a few weeks ago. Painful to palpation and denies any urinary changes

## 2016-07-01 NOTE — ED Notes (Signed)
Pt giving urine sample a this time.

## 2016-07-07 ENCOUNTER — Ambulatory Visit: Payer: Self-pay

## 2016-07-20 ENCOUNTER — Encounter: Payer: Self-pay | Admitting: Internal Medicine

## 2016-08-30 ENCOUNTER — Ambulatory Visit (INDEPENDENT_AMBULATORY_CARE_PROVIDER_SITE_OTHER): Payer: Self-pay | Admitting: Internal Medicine

## 2016-08-30 ENCOUNTER — Encounter: Payer: Self-pay | Admitting: Internal Medicine

## 2016-08-30 VITALS — BP 123/88 | HR 84 | Temp 99.0°F | Ht 74.0 in | Wt 177.0 lb

## 2016-08-30 DIAGNOSIS — Z113 Encounter for screening for infections with a predominantly sexual mode of transmission: Secondary | ICD-10-CM

## 2016-08-30 DIAGNOSIS — B20 Human immunodeficiency virus [HIV] disease: Secondary | ICD-10-CM

## 2016-08-30 DIAGNOSIS — F17200 Nicotine dependence, unspecified, uncomplicated: Secondary | ICD-10-CM

## 2016-08-30 DIAGNOSIS — Z79899 Other long term (current) drug therapy: Secondary | ICD-10-CM

## 2016-08-30 LAB — CBC WITH DIFFERENTIAL/PLATELET
Basophils Absolute: 0 cells/uL (ref 0–200)
Basophils Relative: 0 %
EOS ABS: 42 {cells}/uL (ref 15–500)
Eosinophils Relative: 1 %
HEMATOCRIT: 44.9 % (ref 38.5–50.0)
HEMOGLOBIN: 15.8 g/dL (ref 13.2–17.1)
Lymphocytes Relative: 24 %
Lymphs Abs: 1008 cells/uL (ref 850–3900)
MCH: 33.2 pg — AB (ref 27.0–33.0)
MCHC: 35.2 g/dL (ref 32.0–36.0)
MCV: 94.3 fL (ref 80.0–100.0)
MONO ABS: 882 {cells}/uL (ref 200–950)
MPV: 8.3 fL (ref 7.5–12.5)
Monocytes Relative: 21 %
Neutro Abs: 2268 cells/uL (ref 1500–7800)
Neutrophils Relative %: 54 %
PLATELETS: 238 10*3/uL (ref 140–400)
RBC: 4.76 MIL/uL (ref 4.20–5.80)
RDW: 13.6 % (ref 11.0–15.0)
WBC: 4.2 10*3/uL (ref 3.8–10.8)

## 2016-08-30 LAB — COMPLETE METABOLIC PANEL WITH GFR
ALT: 41 U/L (ref 9–46)
AST: 77 U/L — AB (ref 10–35)
Albumin: 4.2 g/dL (ref 3.6–5.1)
Alkaline Phosphatase: 86 U/L (ref 40–115)
BILIRUBIN TOTAL: 0.3 mg/dL (ref 0.2–1.2)
BUN: 7 mg/dL (ref 7–25)
CALCIUM: 9.6 mg/dL (ref 8.6–10.3)
CHLORIDE: 99 mmol/L (ref 98–110)
CO2: 25 mmol/L (ref 20–31)
CREATININE: 1.04 mg/dL (ref 0.70–1.33)
GFR, Est Non African American: 81 mL/min (ref >=60)
Glucose, Bld: 90 mg/dL (ref 65–99)
Potassium: 5.2 mmol/L (ref 3.5–5.3)
Sodium: 133 mmol/L — ABNORMAL LOW (ref 135–146)
TOTAL PROTEIN: 8 g/dL (ref 6.1–8.1)

## 2016-08-30 LAB — LIPID PANEL
CHOL/HDL RATIO: 3.1 ratio (ref <5.0)
Cholesterol: 248 mg/dL — ABNORMAL HIGH (ref <200)
HDL: 80 mg/dL (ref >40)
LDL CALC: 153 mg/dL — AB (ref <100)
Triglycerides: 75 mg/dL (ref <150)
VLDL: 15 mg/dL (ref <30)

## 2016-08-30 MED ORDER — BICTEGRAVIR-EMTRICITAB-TENOFOV 50-200-25 MG PO TABS: 1.0000 | ORAL_TABLET | Freq: Every day | ORAL | 5 refills | Status: DC

## 2016-08-30 NOTE — Assessment & Plan Note (Signed)
Doing well.  I discussed changing him to [facility] for improved side effect profile long-term and will change with next refill.  Will rtc 3 months on new regimen to assure no issues with labs before. Then can go back to every 6 months.

## 2016-08-30 NOTE — Assessment & Plan Note (Signed)
Will check lipid panel. 

## 2016-08-30 NOTE — Progress Notes (Signed)
CC: Follow up for HIV  Interval history: Currently is asymptomatic and well-controlled on Atripla.  I last saw Scott Khan 1 year ago though he did get labs last August and remained suppressed with a CD4 of 410 and viral load of 21 copies. Since last visit he has no new issues.  Has no associated n/v/d.  Denies any missed doses.  No recent labs.     Prior to Admission medications   Medication Sig Start Date End Date Taking? Authorizing Provider  bictegravir-emtricitabine-tenofovir AF (BIKTARVY) 50-200-25 MG TABS tablet Take 1 tablet by mouth daily. 08/30/16   Gardiner Barefoot, MD    Review of Systems Constitutional: negative for fatigue and malaise Gastrointestinal: negative for diarrhea Integument/breast: negative for rash All other systems reviewed and are negative    Physical Exam: CONSTITUTIONAL:in no apparent distress and alert  Vitals:   08/30/16 1103  BP: 123/88  Pulse: 84  Temp: 99 F (37.2 C)   Eyes: anicteric HENT: no thrush, no cervical lymphadenopathy Respiratory: Normal respiratory effort; CTA B Cardiovascular: RRR  Lab Results  Component Value Date   HIV1RNAQUANT 21 01/08/2016   HIV1RNAQUANT <20 09/25/2015   HIV1RNAQUANT 27 (H) 07/09/2014   No components found for: HIV1GENOTYPRPLUS No components found for: THELPERCELL  SH: continues to smoke cigarettes

## 2016-08-30 NOTE — Assessment & Plan Note (Signed)
I encouraged cessation 

## 2016-08-30 NOTE — Assessment & Plan Note (Signed)
Will screen today 

## 2016-08-31 LAB — RPR

## 2016-08-31 LAB — URINE CYTOLOGY ANCILLARY ONLY
CHLAMYDIA, DNA PROBE: NEGATIVE
NEISSERIA GONORRHEA: NEGATIVE

## 2016-08-31 LAB — T-HELPER CELL (CD4) - (RCID CLINIC ONLY)
CD4 % Helper T Cell: 28 % — ABNORMAL LOW (ref 33–55)
CD4 T Cell Abs: 280 /uL — ABNORMAL LOW (ref 400–2700)

## 2016-09-01 LAB — HIV-1 RNA QUANT-NO REFLEX-BLD
HIV 1 RNA QUANT: 22 {copies}/mL — AB
HIV-1 RNA Quant, Log: 1.34 Log copies/mL — ABNORMAL HIGH

## 2016-11-30 ENCOUNTER — Ambulatory Visit (INDEPENDENT_AMBULATORY_CARE_PROVIDER_SITE_OTHER): Payer: Self-pay | Admitting: Internal Medicine

## 2016-11-30 VITALS — BP 132/93 | HR 83 | Temp 98.2°F | Ht 74.0 in | Wt 176.0 lb

## 2016-11-30 DIAGNOSIS — Z7185 Encounter for immunization safety counseling: Secondary | ICD-10-CM

## 2016-11-30 DIAGNOSIS — B20 Human immunodeficiency virus [HIV] disease: Secondary | ICD-10-CM

## 2016-11-30 DIAGNOSIS — Z7189 Other specified counseling: Secondary | ICD-10-CM

## 2016-11-30 DIAGNOSIS — F17200 Nicotine dependence, unspecified, uncomplicated: Secondary | ICD-10-CM

## 2016-11-30 DIAGNOSIS — Z23 Encounter for immunization: Secondary | ICD-10-CM | POA: Insufficient documentation

## 2016-11-30 LAB — COMPLETE METABOLIC PANEL WITH GFR
ALT: 59 U/L — AB (ref 9–46)
AST: 94 U/L — ABNORMAL HIGH (ref 10–35)
Albumin: 4.4 g/dL (ref 3.6–5.1)
Alkaline Phosphatase: 72 U/L (ref 40–115)
BILIRUBIN TOTAL: 0.5 mg/dL (ref 0.2–1.2)
BUN: 6 mg/dL — ABNORMAL LOW (ref 7–25)
CHLORIDE: 98 mmol/L (ref 98–110)
CO2: 22 mmol/L (ref 20–31)
Calcium: 9.6 mg/dL (ref 8.6–10.3)
Creat: 1 mg/dL (ref 0.70–1.33)
GFR, EST NON AFRICAN AMERICAN: 85 mL/min (ref >=60)
GFR, Est African American: >89 mL/min (ref >=60)
GLUCOSE: 85 mg/dL (ref 65–99)
Potassium: 5.5 mmol/L — ABNORMAL HIGH (ref 3.5–5.3)
SODIUM: 131 mmol/L — AB (ref 135–146)
TOTAL PROTEIN: 8 g/dL (ref 6.1–8.1)

## 2016-11-30 NOTE — Assessment & Plan Note (Signed)
I discussed Menveo and given today

## 2016-11-30 NOTE — Assessment & Plan Note (Signed)
I again discussed cessation and importance for long-term health

## 2016-11-30 NOTE — Progress Notes (Signed)
   Subjective:    Patient ID: Scott Khan, male    DOB: Feb 07, 1961, 56 y.o.   MRN: [id number]  HPI Here for follow up of HIV. I changed him from Atripla to AlbaBiktarvy and he has had no side effects.  He feels better compared to Atripla with no dizziness or CNS symptoms now.  No associated n/v/d.  Did miss 1-2 doses since last seen.  He continues to smoke and is precontemplative.  No weight loss.     Review of Systems  Constitutional: Negative for fatigue.  Gastrointestinal: Negative for diarrhea.  Skin: Negative for rash.  Neurological: Negative for dizziness, light-headedness and headaches.       Objective:   Physical Exam  Constitutional: He appears well-developed and well-nourished. No distress.  HENT:  Mouth/Throat: No oropharyngeal exudate.  Eyes: No scleral icterus.  Cardiovascular: Normal rate, regular rhythm and normal heart sounds.   No murmur heard. Pulmonary/Chest: Effort normal and breath sounds normal. No respiratory distress.  Lymphadenopathy:    He has no cervical adenopathy.  Skin: No rash noted.    SH: + tobacco      Assessment & Plan:

## 2016-11-30 NOTE — Addendum Note (Signed)
Addended by: Wendall MolaOCKERHAM, JACQUELINE A on: 11/30/2016 11:39 AM   Modules accepted: Orders

## 2016-11-30 NOTE — Assessment & Plan Note (Signed)
Doing well on Biktarvy and please with this.  rtc 6 months.  Will check labs today.

## 2016-12-02 LAB — T-HELPER CELL (CD4) - (RCID CLINIC ONLY)
CD4 % Helper T Cell: 31 % — ABNORMAL LOW (ref 33–55)
CD4 T CELL ABS: 300 /uL — AB (ref 400–2700)

## 2016-12-03 LAB — HIV-1 RNA QUANT-NO REFLEX-BLD
HIV 1 RNA Quant: <20 copies/mL — AB
HIV-1 RNA Quant, Log: <1.3 Log copies/mL — AB

## 2016-12-07 ENCOUNTER — Ambulatory Visit: Payer: Self-pay

## 2016-12-17 ENCOUNTER — Encounter: Payer: Self-pay | Admitting: Internal Medicine

## 2017-03-21 ENCOUNTER — Emergency Department (HOSPITAL_COMMUNITY)
Admission: EM | Admit: 2017-03-21 | Discharge: 2017-03-21 | Disposition: A | Discharge status: Home / Self Care | Payer: Self-pay | Attending: Emergency Medicine | Admitting: Emergency Medicine

## 2017-03-21 ENCOUNTER — Emergency Department (HOSPITAL_COMMUNITY): Payer: Self-pay

## 2017-03-21 ENCOUNTER — Encounter (HOSPITAL_COMMUNITY): Payer: Self-pay | Admitting: *Deleted

## 2017-03-21 DIAGNOSIS — Z79899 Other long term (current) drug therapy: Secondary | ICD-10-CM | POA: Insufficient documentation

## 2017-03-21 DIAGNOSIS — W182XXA Fall in (into) shower or empty bathtub, initial encounter: Secondary | ICD-10-CM | POA: Insufficient documentation

## 2017-03-21 DIAGNOSIS — M79651 Pain in right thigh: Secondary | ICD-10-CM | POA: Insufficient documentation

## 2017-03-21 DIAGNOSIS — Y92002 Bathroom of unspecified non-institutional (private) residence single-family (private) house as the place of occurrence of the external cause: Secondary | ICD-10-CM | POA: Insufficient documentation

## 2017-03-21 DIAGNOSIS — Y998 Other external cause status: Secondary | ICD-10-CM | POA: Insufficient documentation

## 2017-03-21 DIAGNOSIS — F17210 Nicotine dependence, cigarettes, uncomplicated: Secondary | ICD-10-CM | POA: Insufficient documentation

## 2017-03-21 DIAGNOSIS — S2232XA Fracture of one rib, left side, initial encounter for closed fracture: Secondary | ICD-10-CM | POA: Insufficient documentation

## 2017-03-21 DIAGNOSIS — Y939 Activity, unspecified: Secondary | ICD-10-CM | POA: Insufficient documentation

## 2017-03-21 DIAGNOSIS — B20 Human immunodeficiency virus [HIV] disease: Secondary | ICD-10-CM | POA: Insufficient documentation

## 2017-03-21 MED ORDER — IBUPROFEN 200 MG PO TABS
600.0000 mg | ORAL_TABLET | Freq: Once | ORAL | Status: AC
  Administered 2017-03-21: 16:00:00 600 mg via ORAL
  Filled 2017-03-21: qty 1

## 2017-03-21 MED ORDER — HYDROCODONE-ACETAMINOPHEN 5-325 MG PO TABS
1.0000 | ORAL_TABLET | Freq: Once | ORAL | Status: AC
  Administered 2017-03-21: 1 via ORAL
  Filled 2017-03-21: qty 1

## 2017-03-21 MED ORDER — HYDROCODONE-ACETAMINOPHEN 5-325 MG PO TABS: 1.0000 | ORAL_TABLET | Freq: Four times a day (QID) | ORAL | 0 refills | Status: DC | PRN

## 2017-03-21 NOTE — ED Triage Notes (Signed)
Pt reports falling last night and having left rib pain since the fall. Pain increases with movement and breathing. Airway intact at triage.

## 2017-03-21 NOTE — Discharge Instructions (Signed)
Your x-ray shows that you have a small rib fracture in the left chest. This will be painful for several weeks. Please take 600 mg ibuprofen every 6 hours for pain and inflammation. You may also take Tylenol for ear pain. Applying heat over the chest wall can also help improve your symptoms.  I have written you a prescription for Norco which is an opioid pain medication. This medicine will make you drowsy, please do not drive, work or drink alcohol while taking this medicine. You may take it with ibuprofen.  Please schedule an appointment with your primary care doctor if your symptoms have not improved in a week.  Return to the emergency department if you experience pain in which you are having trouble breathing or any new or worsening symptoms.

## 2017-03-21 NOTE — ED Provider Notes (Signed)
[facility] EMERGENCY [facility] Provider Note   CSN: [id number] Arrival date & time: 03/21/17  1346     History   Chief Complaint Chief Complaint  Patient presents with  . Fall    HPI Scott Khan is a 56 y.o. male.  HPI   Scott Khan is a 56yo male with a medical history of HIV who is the emergency department for evaluation of left rib pain and right posterior thigh pain. He states that yesterday he states slipped while in the bathroom, hitting his left chest wall against the bathroom tub and also hitting his right thigh against the toilet. He states that since that time he has had 10/10, sharp and constant left rib pain. It is worsened with deep breath and cough. Symptoms are improved with ibuprofen yesterday. He also states that he has mild right posterior thigh pain, which feels "aching and sore" in nature. It is worsened when sitting for long periods. He denies fever, numbness, weakness, shortness of breath, bruising, open wound. Is able to ambulate independently without difficulty.  Past Medical History:  Diagnosis Date  . HIV (human immunodeficiency virus infection) [facility](HCC)     Patient Active Problem List   Diagnosis Date Noted  . Vaccine counseling 11/30/2016  . Encounter for long-term (current) use of medications 03/21/2014  . Routine screening for STI (sexually transmitted infection) 03/21/2014  . 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 Hyperlipidemia 03/20/2012  . Smoking 03/20/2012  . Human immunodeficiency virus (HIV) disease (HCC) 07/14/2010    Past Surgical History:  Procedure Laterality Date  . PATELLA ARTHROPLASTY         Home Medications    Prior to Admission medications   Medication Sig Start Date End Date Taking? Authorizing Provider  bictegravir-emtricitabine-tenofovir AF (BIKTARVY) 50-200-25 MG TABS tablet Take 1 tablet by mouth daily. 08/30/16   Comer, Belia Hemanobert W, MD  HYDROcodone-acetaminophen (NORCO/VICODIN) 5-325 MG tablet Take 1  tablet by mouth every 6 (six) hours as needed. 03/21/17   Kellie ShropshireShrosbree, Emily J, PA-C    Family History History reviewed. No pertinent family history.  Social History Social History  Substance Use Topics  . Smoking status: Current Every Day Smoker    Packs/day: 1.50    Years: 30.00    Types: Cigarettes  . Smokeless tobacco: Never Used     Comment: has not been able to cut back  . Alcohol use 25.2 oz/week    42 Standard drinks or equivalent per week     Comment: a six pack of beer a day     Allergies   Patient has no known allergies.   Review of Systems Review of Systems  Constitutional: Negative for chills, fatigue and fever.  Respiratory: Negative for cough, shortness of breath and wheezing.   Cardiovascular: Negative for palpitations.  Musculoskeletal: Positive for arthralgias (left rib pain) and myalgias (right posterior thigh pain). Negative for back pain, gait problem, joint swelling, neck pain and neck stiffness.  Skin: Negative for color change, rash and wound.     Physical Exam Updated Vital Signs BP 131/87 (BP Location: Right Arm)   Pulse 94   Temp 98.8 F (37.1 C) (Oral)   Resp 14   SpO2 94%   Physical Exam  Constitutional: He is oriented to person, place, and time. He appears well-developed and well-nourished. No distress.  Appears uncomfortable on the chair.  HENT:  Head: Normocephalic and atraumatic.  Eyes: Right eye exhibits no discharge. Left eye exhibits no discharge.  Neck:  Normal range of motion. Neck supple.  Cardiovascular: Normal rate, regular rhythm and intact distal pulses.  Exam reveals no friction rub.   No murmur heard. Pulmonary/Chest: Effort normal and breath sounds normal. No respiratory distress. He has no wheezes. He has no rales.  Point tenderness over the left lateral rib cage. No overlying erythema, deformity, bruising.   Abdominal: Soft. Bowel sounds are normal. There is no tenderness. There is no guarding.  Musculoskeletal:    Full active ROM of bilateral hips, knees, ankle. 5/5 strength in bilateral hips, knees, ankle. Mild tenderness over posterior right thigh, no erythema, bruising or wound.   Neurological: He is alert and oriented to person, place, and time. Coordination normal.  Distal sensation intact to soft touch in bilateral lower extremities. Patellar reflex 2+ bilaterally. Gait normal.  Skin: Skin is warm and dry. Capillary refill takes less than 2 seconds. He is not diaphoretic.  Psychiatric: He has a normal mood and affect. His behavior is normal.  Nursing note and vitals reviewed.    ED Treatments / Results  Labs (all labs ordered are listed, but only abnormal results are displayed) Labs Reviewed - No data to display  EKG  EKG Interpretation None       Radiology Dg Ribs Unilateral W/chest Left  Result Date: 03/21/2017 CLINICAL DATA:  Left rib pain after fall. EXAM: LEFT RIBS AND CHEST - 3+ VIEW COMPARISON:  Chest x-ray dated February 24, 2009. FINDINGS: Questionable nondisplaced fracture of the lateral fourth rib. No other fractures identified. There is no evidence of pneumothorax or pleural effusion. Both lungs are clear. Heart size and mediastinal contours are within normal limits. IMPRESSION: Questionable nondisplaced fracture of the lateral fourth rib. No active cardiopulmonary disease. Electronically Signed   By: Obie Dredge M.D.   On: 03/21/2017 15:07    Procedures Procedures (including critical care time)  Medications Ordered in ED Medications  HYDROcodone-acetaminophen (NORCO/VICODIN) 5-325 MG per tablet 1 tablet (1 tablet Oral Given 03/21/17 1606)  ibuprofen (ADVIL,MOTRIN) tablet 600 mg (600 mg Oral Given 03/21/17 1606)     Initial Impression / Assessment and Plan / ED Course  I have reviewed the triage vital signs and the nursing notes.  Pertinent labs & imaging results that were available during my care of the patient were reviewed by me and considered in my medical  decision making (see chart for details).  Clinical Course as of Mar 22 921  Mon Mar 21, 2017  1533 DG Ribs Unilateral W/Chest Left [ES]    Clinical Course User Index [ES] Kellie Shropshire, PA-C    X-ray reveals nondisplaced fracture of left lateral rib. No pneumothorax. No respiratory distress, VSS. Have counseled patient on the use of NSAIDs for this pain and follow up with primary doctor. Have discussed return precautions including shortness of breath or chest pain at rest and patient voices understanding. Pain in posterior thigh, no overlying erythema, warmth or tenderness to suggest infection. Full active ROM of right hip, 5/5 strentgh. Patient able to ambulate independently without difficulty. This is likely muscle strain of the hamstring muscles. Will also treat symptomatically with NSAIDs and have him follow up with primary doctor if symptoms do not improve.   Final Clinical Impressions(s) / ED Diagnoses   Final diagnoses:  Closed fracture of one rib of left side, initial encounter  Right thigh pain    New Prescriptions Discharge Medication List as of 03/21/2017  4:00 PM    START taking these medications   Details  HYDROcodone-acetaminophen (NORCO/VICODIN) 5-325 MG tablet Take 1 tablet by mouth every 6 (six) hours as needed., Starting Mon 03/21/2017, Print         Kellie Shropshire, PA-C 03/22/17 4098    Mancel Bale, MD 03/22/17 1047

## 2017-03-21 NOTE — ED Notes (Signed)
Patient able to ambulate independently  

## 2017-05-19 ENCOUNTER — Other Ambulatory Visit: Payer: Self-pay

## 2017-06-02 ENCOUNTER — Ambulatory Visit: Payer: Self-pay | Admitting: Internal Medicine

## 2017-08-04 ENCOUNTER — Encounter: Payer: Self-pay | Admitting: Internal Medicine

## 2018-01-31 ENCOUNTER — Other Ambulatory Visit: Payer: Self-pay

## 2018-01-31 ENCOUNTER — Encounter (HOSPITAL_COMMUNITY): Payer: Self-pay | Admitting: Emergency Medicine

## 2018-01-31 ENCOUNTER — Emergency Department (HOSPITAL_COMMUNITY)
Admission: EM | Admit: 2018-01-31 | Discharge: 2018-01-31 | Disposition: A | Discharge status: Home / Self Care | Payer: Self-pay | Attending: Emergency Medicine | Admitting: Emergency Medicine

## 2018-01-31 DIAGNOSIS — Z79899 Other long term (current) drug therapy: Secondary | ICD-10-CM | POA: Insufficient documentation

## 2018-01-31 DIAGNOSIS — B356 Tinea cruris: Secondary | ICD-10-CM | POA: Insufficient documentation

## 2018-01-31 DIAGNOSIS — F17210 Nicotine dependence, cigarettes, uncomplicated: Secondary | ICD-10-CM | POA: Insufficient documentation

## 2018-01-31 DIAGNOSIS — B20 Human immunodeficiency virus [HIV] disease: Secondary | ICD-10-CM | POA: Insufficient documentation

## 2018-01-31 MED ORDER — BUTENAFINE HCL 1 % EX CREA: TOPICAL_CREAM | CUTANEOUS | 0 refills | Status: DC

## 2018-01-31 NOTE — Discharge Instructions (Signed)
Please read attached information. If you experience any new or worsening signs or symptoms please return to the emergency room for evaluation. Please follow-up with your primary care provider or specialist as discussed. Please use medication prescribed only as directed and discontinue taking if you have any concerning signs or symptoms.   °

## 2018-01-31 NOTE — ED Notes (Signed)
Pt verbalized understanding of discharge instructions and denies any further questions at this time.   

## 2018-01-31 NOTE — ED Triage Notes (Signed)
Patient to ED c/o "jock itch," or rash to penile area x 2-3 weeks. Reports trying Lotrimin without relief. Denies discharge or pain.

## 2018-01-31 NOTE — ED Provider Notes (Signed)
[facility] EMERGENCY DEPARTMENT Provider Note   CSN: [id number] Arrival date & time: 01/31/18  1204     History   Chief Complaint Chief Complaint  Patient presents with  . Rash    HPI Scott Khan is a 57 y.o. male.  HPI   57 year old male presents today with complaints rash to his groin. He notes this is extremely itchy. He notes it started approximately a month ago. He started using Lotrimin without improvement in symptoms. Patient notes he recently switched from Axe body wash to [facility] soap. Patient denies any rashes anywhere else, denies any systemic symptoms. He notes he is taking his HIV medication daily as directed.  Past Medical History:  Diagnosis Date  . HIV (human immunodeficiency virus infection) [facility])     Patient Active Problem List   Diagnosis Date Noted  . Vaccine counseling 11/30/2016  . Encounter for long-term (current) use of medications 03/21/2014  . Routine screening for STI (sexually transmitted infection) 03/21/2014  . 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 Hyperlipidemia 03/20/2012  . Smoking 03/20/2012  . Human immunodeficiency virus (HIV) disease (HCC) 07/14/2010    Past Surgical History:  Procedure Laterality Date  . PATELLA ARTHROPLASTY          Home Medications    Prior to Admission medications   Medication Sig Start Date End Date Taking? Authorizing Provider  bictegravir-emtricitabine-tenofovir AF (BIKTARVY) 50-200-25 MG TABS tablet Take 1 tablet by mouth daily. 08/30/16   Gardiner Barefoot, MD  Butenafine HCl 1 % cream Please apply to affected area once daily for two weeks 01/31/18   Adalid Beckmann, Tinnie Gens, PA-C  HYDROcodone-acetaminophen (NORCO/VICODIN) 5-325 MG tablet Take 1 tablet by mouth every 6 (six) hours as needed. 03/21/17   Kellie Shropshire, PA-C    Family History No family history on file.  Social History Social History   Tobacco Use  . Smoking status: Current Every Day Smoker    Packs/day: 1.50   Years: 30.00    Pack years: 45.00    Types: Cigarettes  . Smokeless tobacco: Never Used  . Tobacco comment: has not been able to cut back  Substance Use Topics  . Alcohol use: Yes    Alcohol/week: 42.0 standard drinks    Types: 42 Standard drinks or equivalent per week    Comment: a six pack of beer a day  . Drug use: No     Allergies   Patient has no known allergies.   Review of Systems Review of Systems  All other systems reviewed and are negative.    Physical Exam Updated Vital Signs BP (!) 138/111 (BP Location: Right Arm)   Pulse 78   Temp 98.1 F (36.7 C) (Oral)   Resp 16   Ht 6\' 2"  (1.88 m)   Wt 79.4 kg   SpO2 99%   BMI 22.47 kg/m   Physical Exam  Constitutional: He is oriented to person, place, and time. He appears well-developed and well-nourished.  HENT:  Head: Normocephalic and atraumatic.  Eyes: Pupils are equal, round, and reactive to light. Conjunctivae are normal. Right eye exhibits no discharge. Left eye exhibits no discharge. No scleral icterus.  Neck: Normal range of motion. No JVD present. No tracheal deviation present.  Pulmonary/Chest: Effort normal. No stridor.  Neurological: He is alert and oriented to person, place, and time. Coordination normal.  Skin:  Hyperpigmented rash noted to groin- no vesicles or lesions, no discharge   Psychiatric: He has a normal mood and  affect. His behavior is normal. Judgment and thought content normal.  Nursing note and vitals reviewed.    ED Treatments / Results  Labs (all labs ordered are listed, but only abnormal results are displayed) Labs Reviewed - No data to display  EKG None  Radiology No results found.  Procedures Procedures (including critical care time)  Medications Ordered in ED Medications - No data to display   Initial Impression / Assessment and Plan / ED Course  I have reviewed the triage vital signs and the nursing notes.  Pertinent labs & imaging results that were available  during my care of the patient were reviewed by me and considered in my medical decision making (see chart for details).      Assessment/Plan: patient presents with rash to his groin, high suspicion for fungal, he'll be treated with antifungal medication, encouraged follow-up as an outpatient with primary care. Strict return precautions given. He verbalized understanding and agreement to today's plan.      Final Clinical Impressions(s) / ED Diagnoses   Final diagnoses:  Tinea cruris    ED Discharge Orders         Ordered    Butenafine HCl 1 % cream     01/31/18 1408           Esai Stecklein, Tinnie Gens, PA-C 01/31/18 1439    Arby Barrette, MD 02/16/18 1024

## 2018-01-31 NOTE — ED Notes (Signed)
ED Provider at bedside. 

## 2018-02-21 ENCOUNTER — Encounter: Payer: Self-pay | Admitting: Internal Medicine

## 2018-02-23 ENCOUNTER — Encounter (HOSPITAL_COMMUNITY): Payer: Self-pay | Admitting: *Deleted

## 2018-02-23 ENCOUNTER — Other Ambulatory Visit: Payer: Self-pay

## 2018-02-23 ENCOUNTER — Emergency Department (HOSPITAL_COMMUNITY)
Admission: EM | Admit: 2018-02-23 | Discharge: 2018-02-23 | Disposition: A | Discharge status: Home / Self Care | Payer: Self-pay | Attending: Emergency Medicine | Admitting: Emergency Medicine

## 2018-02-23 DIAGNOSIS — R21 Rash and other nonspecific skin eruption: Secondary | ICD-10-CM | POA: Insufficient documentation

## 2018-02-23 MED ORDER — CLOTRIMAZOLE-BETAMETHASONE 1-0.05 % EX CREA: TOPICAL_CREAM | CUTANEOUS | 0 refills | Status: DC

## 2018-02-23 NOTE — Discharge Instructions (Signed)
Return here as needed.  Follow-up with your primary doctor as soon as possible. °

## 2018-02-23 NOTE — ED Provider Notes (Signed)
[facility] EMERGENCY DEPARTMENT Provider Note   CSN: [id number] Arrival date & time: 02/23/18  4098     History   Chief Complaint Chief Complaint  Patient presents with  . Rash    HPI Scott Khan is a 57 y.o. male.  HPI Patient presents to the emergency department with rash to the groin area and he states that he was diagnosed with jock itch and the pharmacy gave him something over-the-counter instead of filling the prescription patient states that the medicine did not seem to help.  Patient states that the area is somewhat painful he does itch.  There is no lesions associated with this.  Patient denies fever, nausea, vomiting, dysuria, weakness, dizziness, back pain, or syncope. Past Medical History:  Diagnosis Date  . HIV (human immunodeficiency virus infection) [facility])     Patient Active Problem List   Diagnosis Date Noted  . Vaccine counseling 11/30/2016  . Encounter for long-term (current) use of medications 03/21/2014  . Routine screening for STI (sexually transmitted infection) 03/21/2014  . 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 Hyperlipidemia 03/20/2012  . Smoking 03/20/2012  . Human immunodeficiency virus (HIV) disease (HCC) 07/14/2010    Past Surgical History:  Procedure Laterality Date  . PATELLA ARTHROPLASTY          Home Medications    Prior to Admission medications   Medication Sig Start Date End Date Taking? Authorizing Provider  bictegravir-emtricitabine-tenofovir AF (BIKTARVY) 50-200-25 MG TABS tablet Take 1 tablet by mouth daily. 08/30/16   Gardiner Barefoot, MD  Butenafine HCl 1 % cream Please apply to affected area once daily for two weeks 01/31/18   Hedges, Tinnie Gens, PA-C  HYDROcodone-acetaminophen (NORCO/VICODIN) 5-325 MG tablet Take 1 tablet by mouth every 6 (six) hours as needed. 03/21/17   Kellie Shropshire, PA-C    Family History No family history on file.  Social History Social History   Tobacco Use  . Smoking  status: Current Every Day Smoker    Packs/day: 1.50    Years: 30.00    Pack years: 45.00    Types: Cigarettes  . Smokeless tobacco: Never Used  . Tobacco comment: has not been able to cut back  Substance Use Topics  . Alcohol use: Yes    Alcohol/week: 42.0 standard drinks    Types: 42 Standard drinks or equivalent per week    Comment: a six pack of beer a day  . Drug use: No     Allergies   Patient has no known allergies.   Review of Systems Review of Systems All other systems negative except as documented in the HPI. All pertinent positives and negatives as reviewed in the HPI.  Physical Exam Updated Vital Signs BP 138/90 (BP Location: Right Arm)   Pulse 92   Temp 97.8 F (36.6 C) (Oral)   Resp 16   Ht 6\' 2"  (1.88 m)   Wt 79.4 kg   SpO2 100%   BMI 22.47 kg/m   Physical Exam  Constitutional: He is oriented to person, place, and time. He appears well-developed and well-nourished. No distress.  HENT:  Head: Normocephalic and atraumatic.  Eyes: Pupils are equal, round, and reactive to light.  Pulmonary/Chest: Effort normal.  Genitourinary:     Neurological: He is alert and oriented to person, place, and time.  Skin: Skin is warm and dry.  Psychiatric: He has a normal mood and affect.  Nursing note and vitals reviewed.    ED Treatments / Results  Labs (all labs ordered are listed, but only abnormal results are displayed) Labs Reviewed - No data to display  EKG None  Radiology No results found.  Procedures Procedures (including critical care time)  Medications Ordered in ED Medications - No data to display   Initial Impression / Assessment and Plan / ED Course  I have reviewed the triage vital signs and the nursing notes.  Pertinent labs & imaging results that were available during my care of the patient were reviewed by me and considered in my medical decision making (see chart for details).     Patient be treated for a tinea type rash based  on his location and examination.  There is no signs of lesions or other abnormality to the rash other than hyperpigmentation.  Final Clinical Impressions(s) / ED Diagnoses   Final diagnoses:  None    ED Discharge Orders    None       Charlestine Night, PA-C 02/23/18 1049    Sabas Sous, MD 02/23/18 [phone]

## 2018-02-23 NOTE — ED Triage Notes (Signed)
Pt seen here 1-2 wks ago and  Pt states, "I was diagnosed with jock itch and the pharmacy gave me something over the  Counter instead of filling my prescription. I still hurt." A&O x4

## 2018-06-05 IMAGING — US US ART/VEN ABD/PELV/SCROTUM DOPPLER LTD
1 series · 14 of 25 positions shown · non-contrast
Comparison: None.

CLINICAL DATA: Testicular torsion, testicular mass.  HIV

EXAM:
SCROTAL ULTRASOUND
DOPPLER ULTRASOUND OF THE TESTICLES
TECHNIQUE: Complete ultrasound examination of the testicles, epididymis, and
other scrotal structures was performed. Color and spectral Doppler
ultrasound were also utilized to evaluate blood flow to the
testicles.

[Series 1: us art/ven abd/pelv/scrotum doppler ltd · 0.08mm/px · 62 acquisitions, 14 frames shown]
[im 1/62]
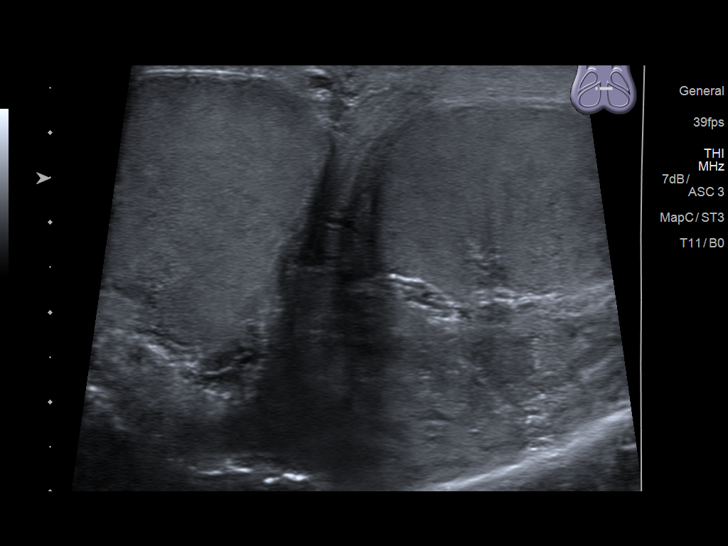
[im 6/62]
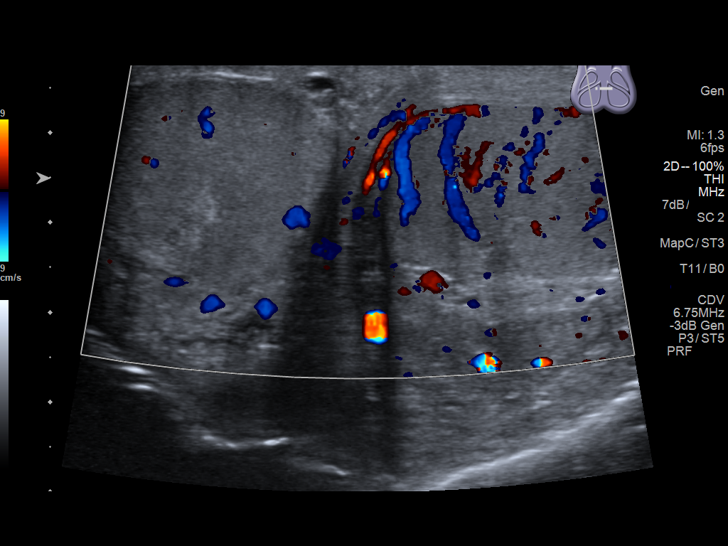
[im 11/62]
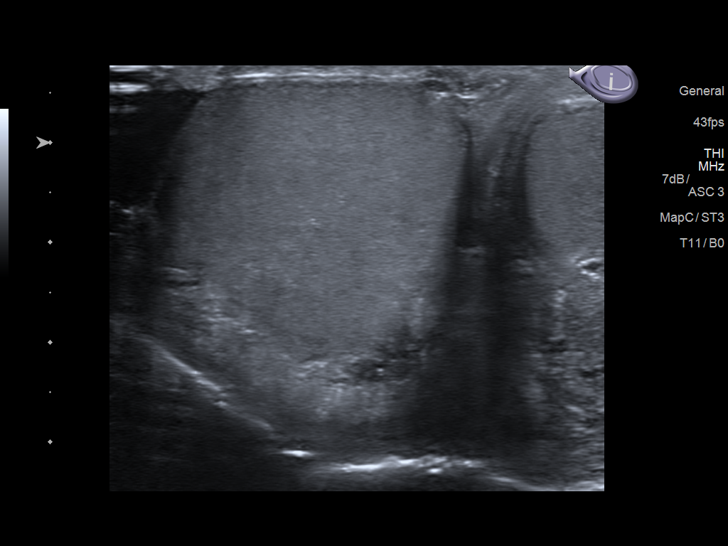
[im 16/62]
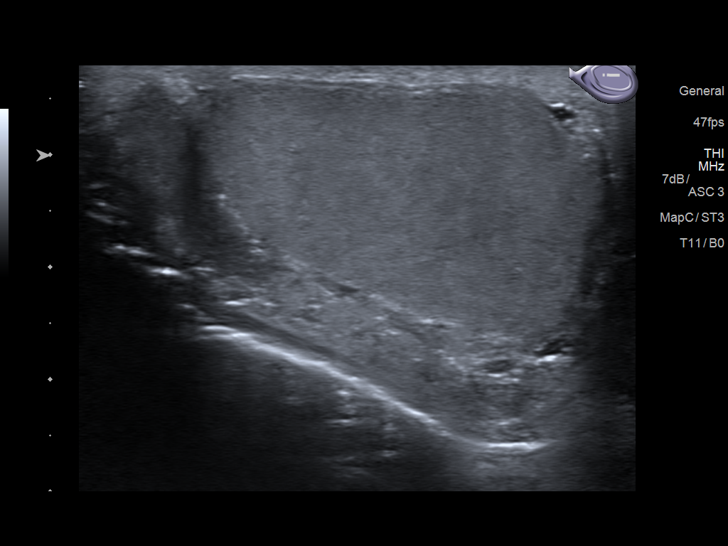
[im 21/62]
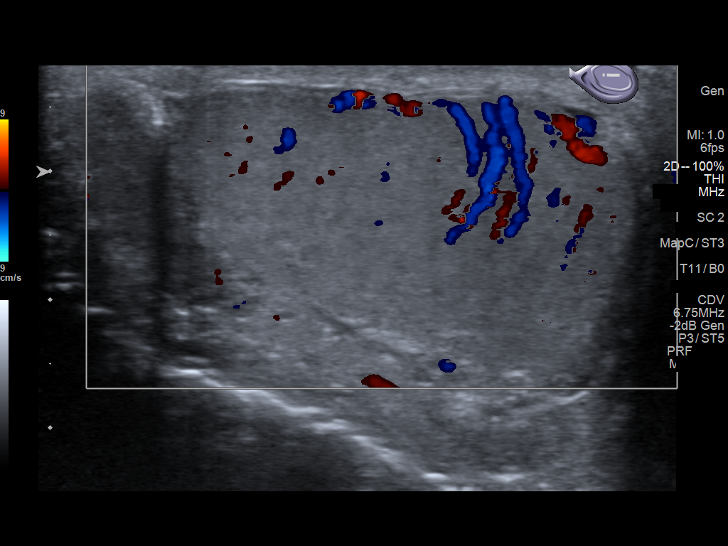
[im 23/62]
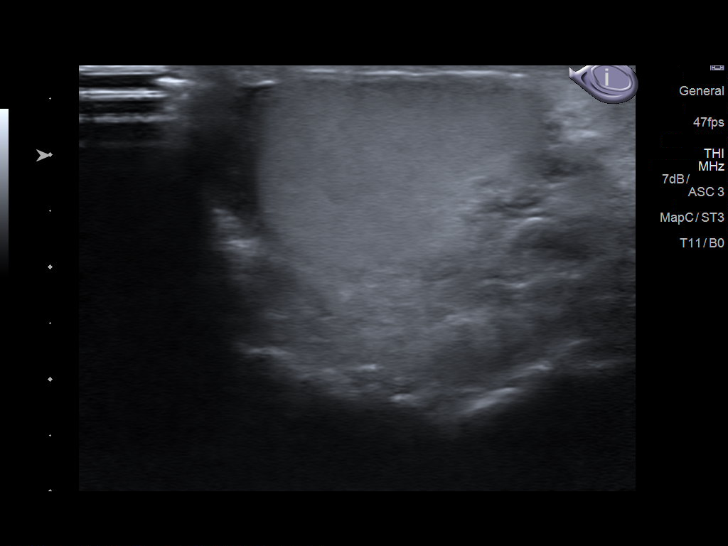
[im 28/62]
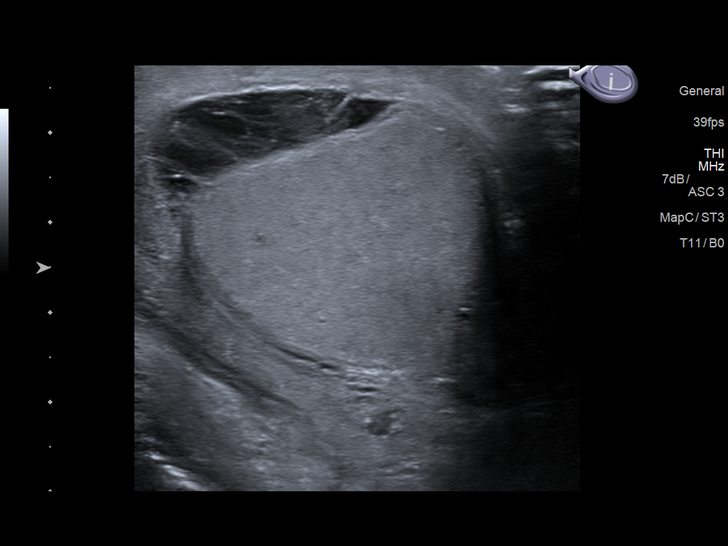
[im 34/62]
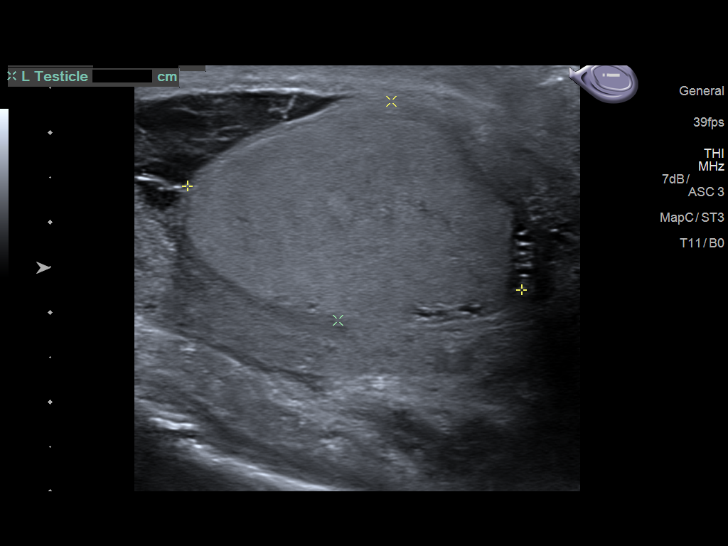
[im 39/62]
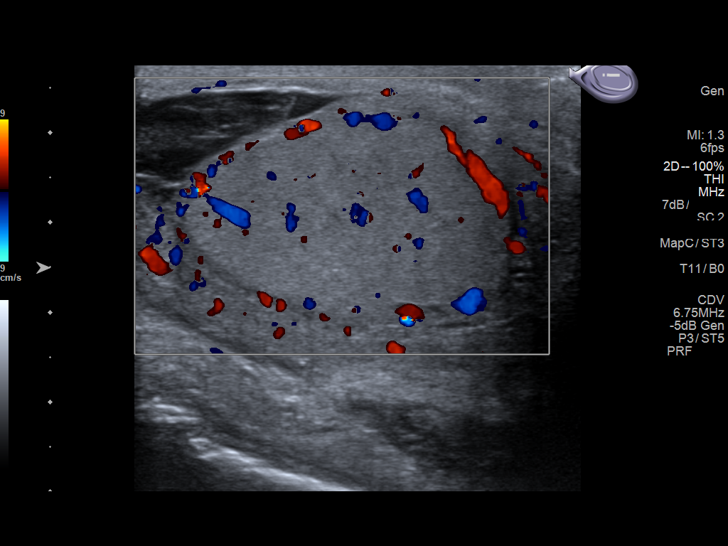
[im 41/62]
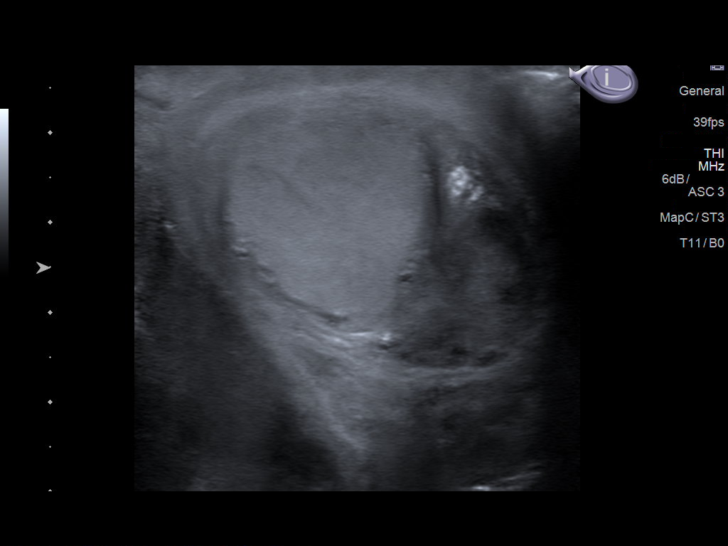
[im 46/62]
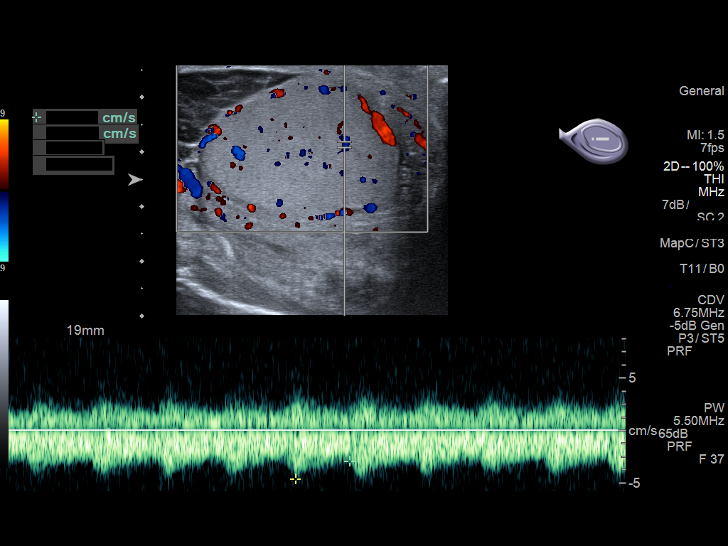
[im 51/62]
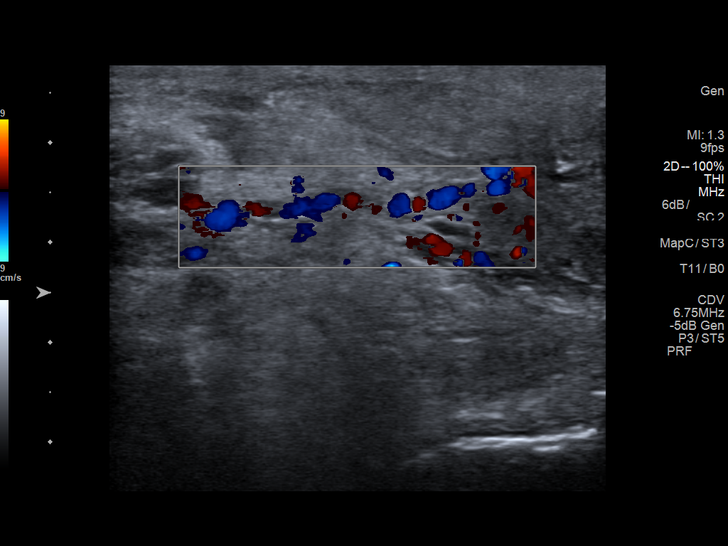
[im 56/62]
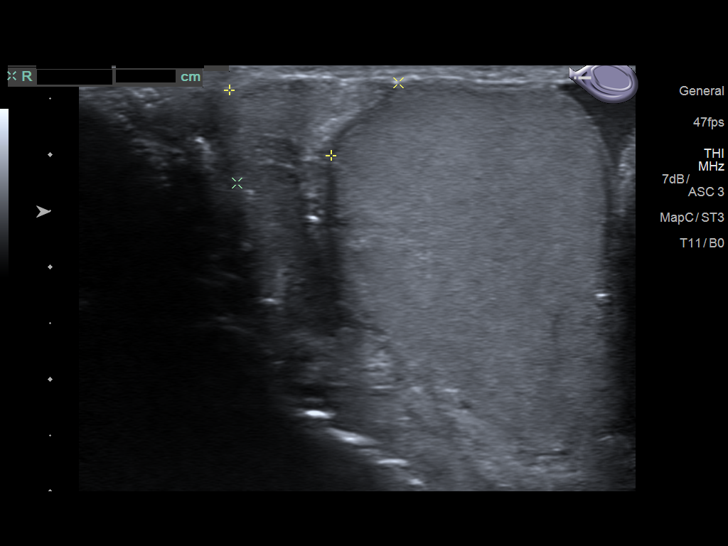
[im 62/62]
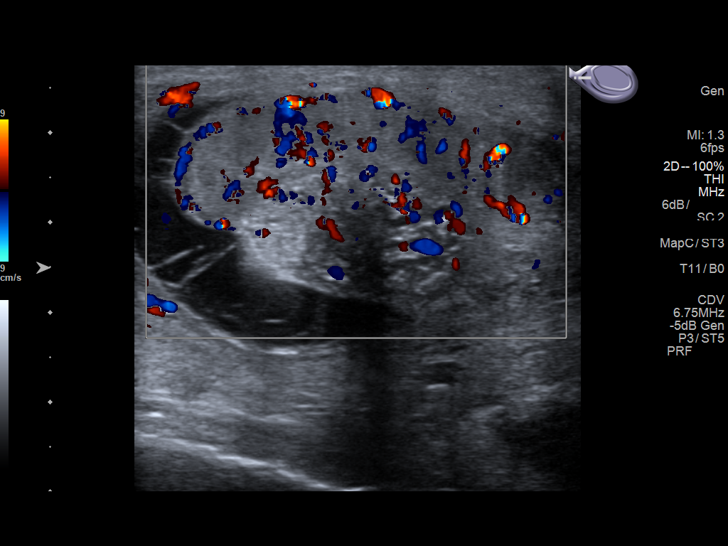

[14 of 25 positions shown; findings below may reference images not displayed]

FINDINGS: Right testicle

Measurements: 3.9 x 2.3 x 3.2 cm. No mass or microlithiasis
visualized.

Left testicle

Measurements: 3.9 x 2.5 x 3.5 cm. No mass or microlithiasis
visualized.

Right epididymis:  Normal in size and appearance.

Left epididymis:  Normal in size and appearance.

Hydrocele:  Complex hydrocele on the left with multiple loculations.

Varicocele:  None visualized.

Pulsed Doppler interrogation of both testes demonstrates no evidence
of testicular torsion.

Increased vascularity to the left testicle and epididymis suggesting
epididymal orchitis.
IMPRESSION: Negative for testicular torsion

Hypervascularity to the left epididymis and left testicle suggesting
infection. Complex hydrocele around the left testicle which may be
due to acute or chronic infection.

## 2018-07-17 ENCOUNTER — Other Ambulatory Visit: Payer: Self-pay | Admitting: *Deleted

## 2018-07-17 DIAGNOSIS — B20 Human immunodeficiency virus [HIV] disease: Secondary | ICD-10-CM

## 2018-07-24 ENCOUNTER — Other Ambulatory Visit: Payer: Self-pay

## 2018-07-24 DIAGNOSIS — B20 Human immunodeficiency virus [HIV] disease: Secondary | ICD-10-CM

## 2018-07-25 LAB — T-HELPER CELL (CD4) - (RCID CLINIC ONLY)
CD4 % Helper T Cell: 26 % — ABNORMAL LOW (ref 33–55)
CD4 T Cell Abs: 170 /uL — ABNORMAL LOW (ref 400–2700)

## 2018-07-26 ENCOUNTER — Encounter: Payer: Self-pay | Admitting: Internal Medicine

## 2018-07-26 LAB — URINE CYTOLOGY ANCILLARY ONLY
CHLAMYDIA, DNA PROBE: NEGATIVE
Neisseria Gonorrhea: NEGATIVE

## 2018-08-01 LAB — CBC WITH DIFFERENTIAL/PLATELET
ABSOLUTE MONOCYTES: 989 {cells}/uL — AB (ref 200–950)
BASOS PCT: 0.4 %
Basophils Absolute: 18 cells/uL (ref 0–200)
Eosinophils Absolute: 9 cells/uL — ABNORMAL LOW (ref 15–500)
Eosinophils Relative: 0.2 %
HEMATOCRIT: 46.3 % (ref 38.5–50.0)
HEMOGLOBIN: 15.7 g/dL (ref 13.2–17.1)
Lymphs Abs: 529 cells/uL — ABNORMAL LOW (ref 850–3900)
MCH: 31.1 pg (ref 27.0–33.0)
MCHC: 33.9 g/dL (ref 32.0–36.0)
MCV: 91.7 fL (ref 80.0–100.0)
MPV: 9.1 fL (ref 7.5–12.5)
Monocytes Relative: 21.5 %
Neutro Abs: 3054 cells/uL (ref 1500–7800)
Neutrophils Relative %: 66.4 %
Platelets: 219 10*3/uL (ref 140–400)
RBC: 5.05 10*6/uL (ref 4.20–5.80)
RDW: 13.3 % (ref 11.0–15.0)
TOTAL LYMPHOCYTE: 11.5 %
WBC: 4.6 10*3/uL (ref 3.8–10.8)

## 2018-08-01 LAB — COMPLETE METABOLIC PANEL WITH GFR
AG RATIO: 1.1 (calc) (ref 1.0–2.5)
ALBUMIN MSPROF: 3.9 g/dL (ref 3.6–5.1)
ALKALINE PHOSPHATASE (APISO): 78 U/L (ref 35–144)
ALT: 38 U/L (ref 9–46)
AST: 52 U/L — ABNORMAL HIGH (ref 10–35)
BILIRUBIN TOTAL: 0.3 mg/dL (ref 0.2–1.2)
BUN: 7 mg/dL (ref 7–25)
CHLORIDE: 94 mmol/L — AB (ref 98–110)
CO2: 25 mmol/L (ref 20–32)
CREATININE: 0.98 mg/dL (ref 0.70–1.33)
Calcium: 8.8 mg/dL (ref 8.6–10.3)
GFR, Est African American: 99 mL/min/{1.73_m2} (ref >=60)
GFR, Est Non African American: 86 mL/min/{1.73_m2} (ref >=60)
GLOBULIN: 3.4 g/dL (ref 1.9–3.7)
Glucose, Bld: 92 mg/dL (ref 65–99)
POTASSIUM: 4.2 mmol/L (ref 3.5–5.3)
SODIUM: 126 mmol/L — AB (ref 135–146)
Total Protein: 7.3 g/dL (ref 6.1–8.1)

## 2018-08-01 LAB — HIV-1 GENOTYPE: HIV-1 Genotype: DETECTED — AB

## 2018-08-01 LAB — HIV-1 RNA ULTRAQUANT REFLEX TO GENTYP+
HIV 1 RNA QUANT: 1210 {copies}/mL — AB
HIV-1 RNA Quant, Log: 3.08 Log copies/mL — ABNORMAL HIGH

## 2018-08-01 LAB — RPR: RPR: NONREACTIVE

## 2018-08-15 ENCOUNTER — Other Ambulatory Visit: Payer: Self-pay

## 2018-08-15 ENCOUNTER — Encounter: Payer: Self-pay | Admitting: Internal Medicine

## 2018-08-15 ENCOUNTER — Ambulatory Visit (INDEPENDENT_AMBULATORY_CARE_PROVIDER_SITE_OTHER): Payer: Self-pay | Admitting: Internal Medicine

## 2018-08-15 VITALS — BP 127/88 | HR 82 | Temp 98.6°F | Wt 166.0 lb

## 2018-08-15 DIAGNOSIS — B20 Human immunodeficiency virus [HIV] disease: Secondary | ICD-10-CM

## 2018-08-15 DIAGNOSIS — Z9189 Other specified personal risk factors, not elsewhere classified: Secondary | ICD-10-CM | POA: Insufficient documentation

## 2018-08-15 DIAGNOSIS — F17200 Nicotine dependence, unspecified, uncomplicated: Secondary | ICD-10-CM

## 2018-08-15 MED ORDER — SULFAMETHOXAZOLE-TRIMETHOPRIM 800-160 MG PO TABS: 1.0000 | ORAL_TABLET | Freq: Every day | ORAL | 2 refills | Status: DC

## 2018-08-15 MED ORDER — BICTEGRAVIR-EMTRICITAB-TENOFOV 50-200-25 MG PO TABS: 1.0000 | ORAL_TABLET | Freq: Every day | ORAL | 5 refills | Status: DC

## 2018-08-15 NOTE — Assessment & Plan Note (Signed)
Discussed cessation 

## 2018-08-15 NOTE — Assessment & Plan Note (Signed)
CD 4 low, will start Bactrim daily

## 2018-08-15 NOTE — Progress Notes (Signed)
HPI: Scott Khan is a 58 y.o. male who presents to the [facility] pharmacy clinic for HIV follow-up.  Patient Active Problem List   Diagnosis Date Noted  . At risk for opportunistic infections 08/15/2018  . Vaccine counseling 11/30/2016  . Encounter for long-term (current) use of medications 03/21/2014  . Routine screening for STI (sexually transmitted infection) 03/21/2014  . 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 Hyperlipidemia 03/20/2012  . Smoking 03/20/2012  . Human immunodeficiency virus (HIV) disease (HCC) 07/14/2010    Patient's Medications  New Prescriptions   SULFAMETHOXAZOLE-TRIMETHOPRIM (BACTRIM DS,SEPTRA DS) 800-160 MG TABLET    Take 1 tablet by mouth daily.  Previous Medications   BUTENAFINE HCL 1 % CREAM    Please apply to affected area once daily for two weeks   CLOTRIMAZOLE-BETAMETHASONE (LOTRISONE) CREAM    Apply to affected area 2 times daily prn   HYDROCODONE-ACETAMINOPHEN (NORCO/VICODIN) 5-325 MG TABLET    Take 1 tablet by mouth every 6 (six) hours as needed.  Modified Medications   Modified Medication Previous Medication   BICTEGRAVIR-EMTRICITABINE-TENOFOVIR AF (BIKTARVY) 50-200-25 MG TABS TABLET bictegravir-emtricitabine-tenofovir AF (BIKTARVY) 50-200-25 MG TABS tablet      Take 1 tablet by mouth daily.    Take 1 tablet by mouth daily.  Discontinued Medications   No medications on file    Allergies: No Known Allergies  Past Medical History: Past Medical History:  Diagnosis Date  . HIV (human immunodeficiency virus infection) (HCC)     Social History: Social History   Socioeconomic History  . Marital status: Legally Separated    Spouse name: Not on file  . Number of children: Not on file  . Years of education: Not on file  . Highest education level: Not on file  Occupational History  . Not on file  Social Needs  . Financial resource strain: Not on file  . Food insecurity:    Worry: Not on file    Inability: Not on file  . Transportation  needs:    Medical: Not on file    Non-medical: Not on file  Tobacco Use  . Smoking status: Current Every Day Smoker    Packs/day: 1.50    Years: 30.00    Pack years: 45.00    Types: Cigarettes  . Smokeless tobacco: Never Used  . Tobacco comment: has not been able to cut back  Substance and Sexual Activity  . Alcohol use: Yes    Alcohol/week: 42.0 standard drinks    Types: 42 Standard drinks or equivalent per week    Comment: a six pack of beer a day  . Drug use: No  . Sexual activity: Yes    Partners: Female    Birth control/protection: Condom    Comment: pt declined condoms  Lifestyle  . Physical activity:    Days per week: Not on file    Minutes per session: Not on file  . Stress: Not on file  Relationships  . Social connections:    Talks on phone: Not on file    Gets together: Not on file    Attends religious service: Not on file    Active member of club or organization: Not on file    Attends meetings of clubs or organizations: Not on file    Relationship status: Not on file  Other Topics Concern  . Not on file  Social History Narrative  . Not on file    Labs: Lab Results  Component Value Date   HIV1RNAQUANT 1,210 (H)  07/24/2018   HIV1RNAQUANT <20 DETECTED (A) 11/30/2016   HIV1RNAQUANT 22 (H) 08/30/2016   CD4TABS 170 (L) 07/24/2018   CD4TABS 300 (L) 11/30/2016   CD4TABS 280 (L) 08/30/2016    RPR and STI Lab Results  Component Value Date   LABRPR NON-REACTIVE 07/24/2018   LABRPR NON REAC 08/30/2016   LABRPR NON REAC 09/25/2015   LABRPR NON REAC 07/09/2014   LABRPR NON REAC 07/05/2013    STI Results GC CT  07/24/2018 Negative Negative  08/30/2016 Negative Negative  09/25/2015 Negative Negative    Hepatitis B Lab Results  Component Value Date   HEPBSAB POS (A) 02/12/2011   HEPBSAG NEGATIVE 02/12/2011   HEPBCAB NEG 02/12/2011   Hepatitis C No results found for: HEPCAB, HCVRNAPCRQN Hepatitis A Lab Results  Component Value Date   HAV POS (A)  02/12/2011   Lipids: Lab Results  Component Value Date   CHOL 248 (H) 08/30/2016   TRIG 75 08/30/2016   HDL 80 08/30/2016   CHOLHDL 3.1 08/30/2016   VLDL 15 08/30/2016   LDLCALC 153 (H) 08/30/2016    Current HIV Regimen: Biktarvy  Assessment:  Scott Khan is a 58 y.o. male who presents to the [facility] pharmacy clinic for HIV follow-up. Patient reported that he had no side effects, and that Biktarvy was well tolerated. He reports that he has not had his Biktarvy in 2-3 months. Patient reported that he has been having issues getting his refills from the pharmacy.  He said that they used to call him when it was time for a refill, but that they haven't been doing that. Patient reported that he is not taking any other medications.  Patient declined receiving medications by mail, preferring pick-up.  Patient previously on ADAP.   LFTs from 07/2018 were within normal limits, except slight elevation of AST, which was less than two times the upper limit of normal. Pharmacist confirmed patient has adequate renal function and hepatic function to take Biktarvy. Pharmacist reviewed that patient did not have any contraindications to [facility], and has no drug-drug interactions.   Plan: - Continue Biktarvy 1 tablet by mouth once a day - Dr. Luciana Axe restarted Bactrim due to low CD4 count (170). - Follow up with clinical pharmacist in 6 weeks, on 09/20/2018 - Call us if you are having any issues with the pharmacy in getting your prescription  Lenora Boys, PharmD Candidate, [employer] [facility] 08/15/2018, 12:08 PM

## 2018-08-15 NOTE — Progress Notes (Signed)
2-3 months

## 2018-08-15 NOTE — Progress Notes (Signed)
   Subjective:    Patient ID: Scott Khan, male    DOB: July 30, 1960, 58 y.o.   MRN: [id number]  HPI Here for follow up of HIV Has not been here in about 18 months.  Previously was on [employer].  Had not heard about refills and did not reach out.  No new issues including no weight loss, no diarrhea.  Still smoking but wanting to quit.    Review of Systems  Constitutional: Negative for fatigue.  Gastrointestinal: Negative for diarrhea and nausea.  Skin: Negative for rash.       Objective:   Physical Exam Constitutional:      Appearance: Normal appearance.  Eyes:     General: No scleral icterus. Cardiovascular:     Rate and Rhythm: Normal rate and regular rhythm.     Heart sounds: No murmur.  Pulmonary:     Effort: Pulmonary effort is normal. No respiratory distress.     Breath sounds: Normal breath sounds.  Skin:    Findings: No rash.  Neurological:     Mental Status: He is alert.   SH: + tobacco        Assessment & Plan:

## 2018-08-15 NOTE — Assessment & Plan Note (Signed)
Worsened but now back in care.  Restart Biktarvy and rtc 4 months. Will have close PharmD monitoring

## 2018-09-14 IMAGING — DX DG RIBS W/ CHEST 3+V*L*
4 series · 4 of 4 positions shown · non-contrast
Comparison: Chest x-ray dated February 24, 2009.

CLINICAL DATA: Left rib pain after fall.

EXAM:
LEFT RIBS AND CHEST - 3+ VIEW

[chest pa]
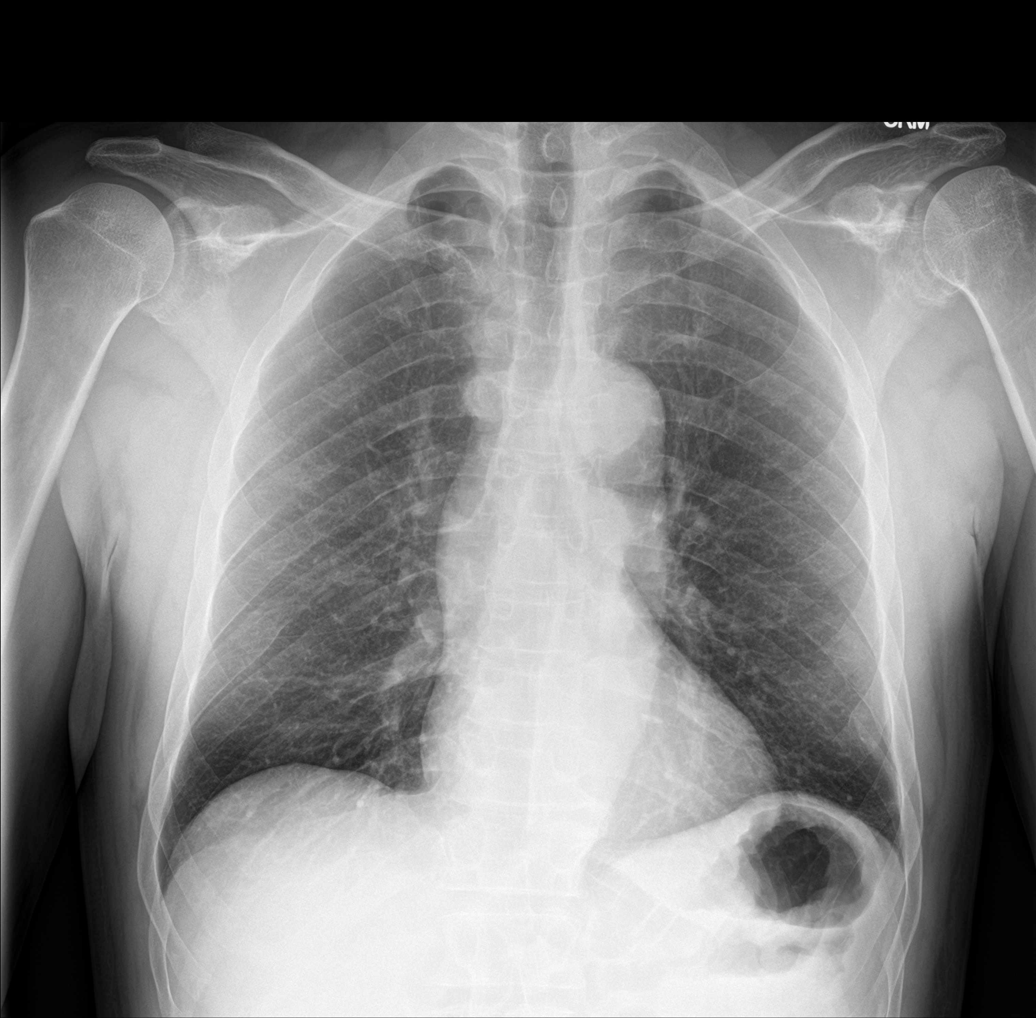

[rib pa obl (1 of 3)]
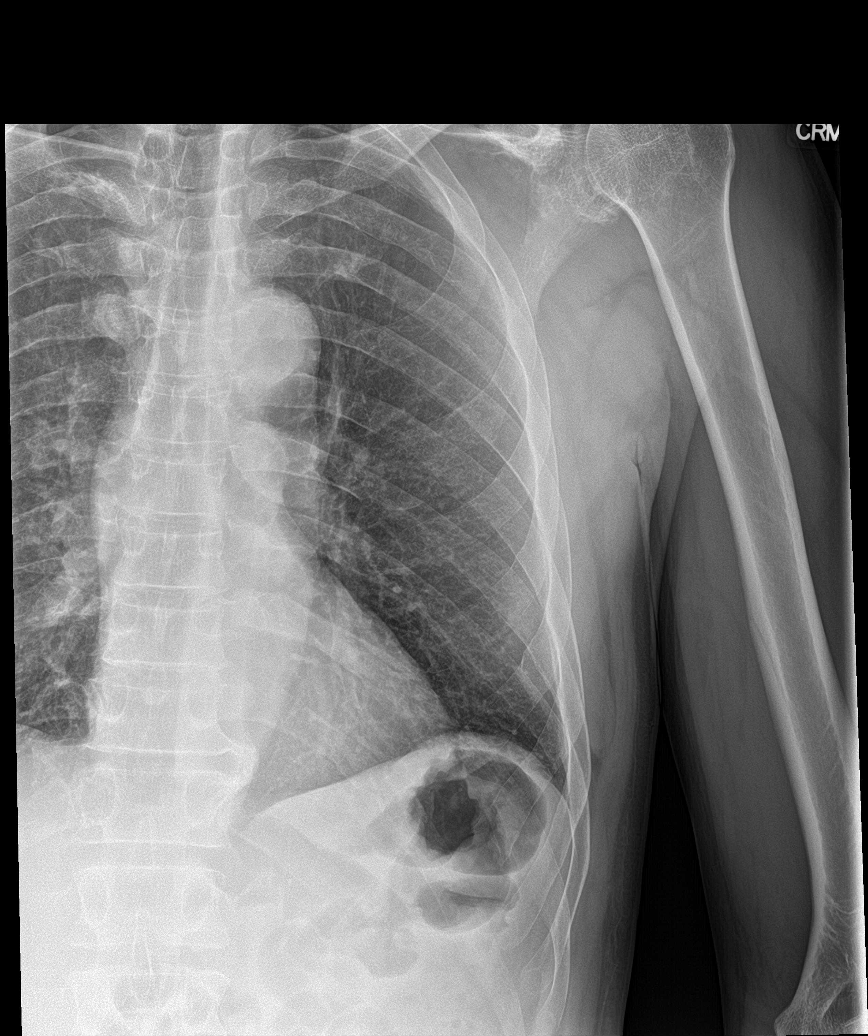

[rib pa obl (2 of 3)]
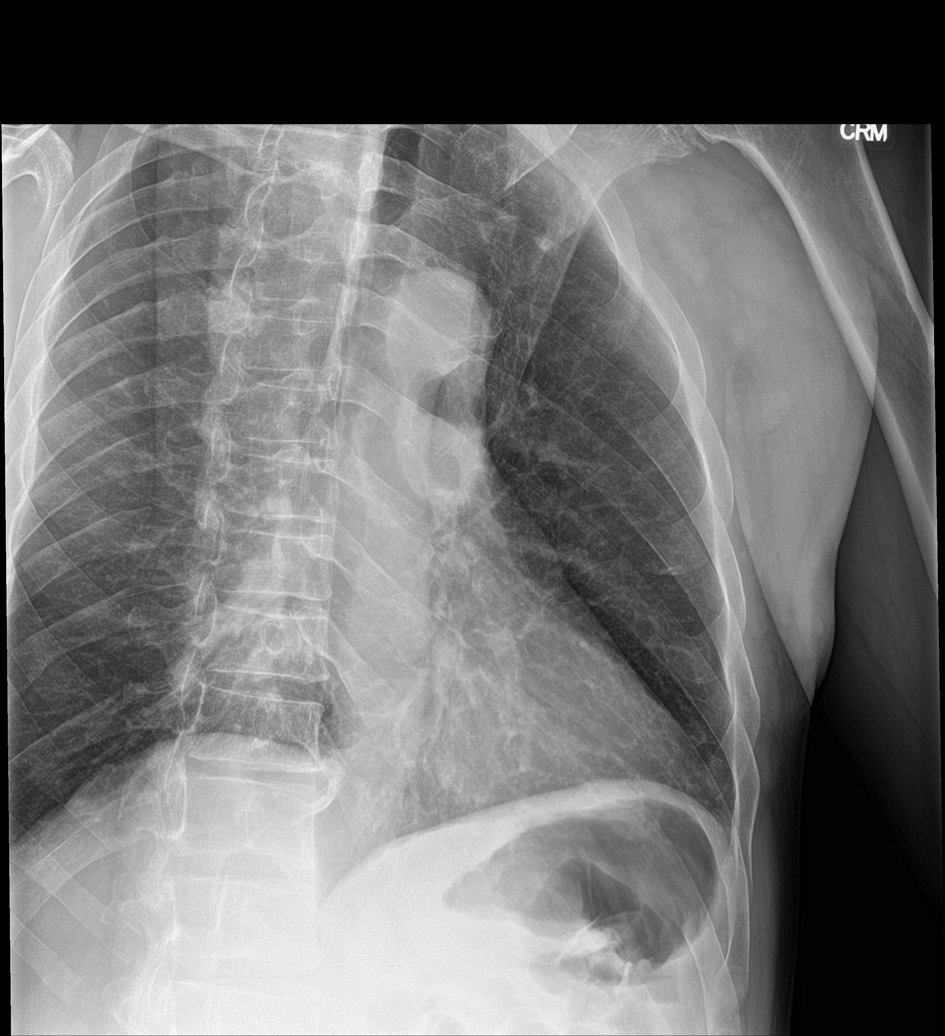

[rib pa obl (3 of 3)]
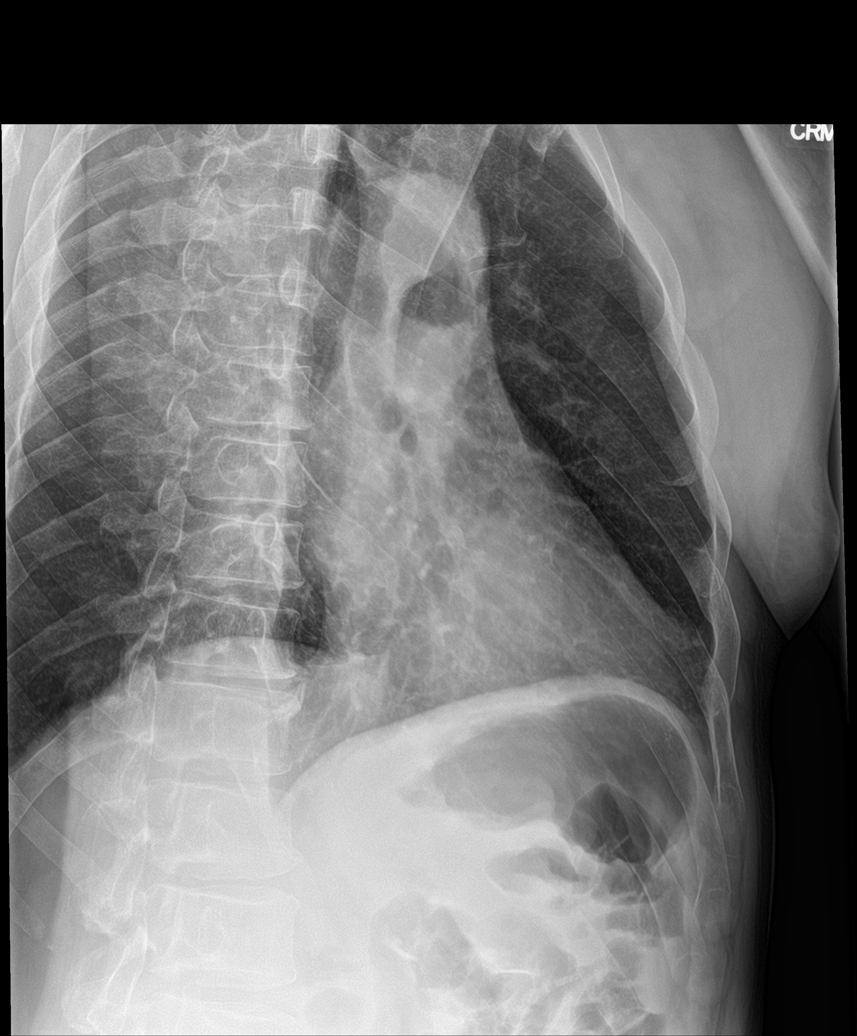

[4 of 4 positions shown; findings below may reference images not displayed]

FINDINGS: Questionable nondisplaced fracture of the lateral fourth rib. No
other fractures identified. There is no evidence of pneumothorax or
pleural effusion. Both lungs are clear. Heart size and mediastinal
contours are within normal limits.
IMPRESSION: Questionable nondisplaced fracture of the lateral fourth rib. No
active cardiopulmonary disease.

## 2018-09-20 ENCOUNTER — Ambulatory Visit (INDEPENDENT_AMBULATORY_CARE_PROVIDER_SITE_OTHER): Payer: Self-pay | Admitting: Pharmacist

## 2018-09-20 ENCOUNTER — Other Ambulatory Visit: Payer: Self-pay

## 2018-09-20 DIAGNOSIS — B20 Human immunodeficiency virus [HIV] disease: Secondary | ICD-10-CM

## 2018-09-20 NOTE — Progress Notes (Signed)
Virtual Visit via Telephone Note  I connected with Scott Khan on 09/20/18 at 11:30 AM EDT by telephone and verified that I am speaking with the correct person using two identifiers.   I discussed the limitations, risks, security and privacy concerns of performing an evaluation and management service by telephone and the availability of in person appointments. I also discussed with the patient that there may be a patient responsible charge related to this service. The patient expressed understanding and agreed to proceed.   HPI: Scott Khan is a 58 y.o. male who presents for HIV follow-up.  Patient Active Problem List   Diagnosis Date Noted  . At risk for opportunistic infections 08/15/2018  . Vaccine counseling 11/30/2016  . Encounter for long-term (current) use of medications 03/21/2014  . Routine screening for STI (sexually transmitted infection) 03/21/2014  . 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 Hyperlipidemia 03/20/2012  . Smoking 03/20/2012  . Human immunodeficiency virus (HIV) disease (HCC) 07/14/2010    Patient's Medications  New Prescriptions   No medications on file  Previous Medications   BICTEGRAVIR-EMTRICITABINE-TENOFOVIR AF (BIKTARVY) 50-200-25 MG TABS TABLET    Take 1 tablet by mouth daily.   BUTENAFINE HCL 1 % CREAM    Please apply to affected area once daily for two weeks   CLOTRIMAZOLE-BETAMETHASONE (LOTRISONE) CREAM    Apply to affected area 2 times daily prn   HYDROCODONE-ACETAMINOPHEN (NORCO/VICODIN) 5-325 MG TABLET    Take 1 tablet by mouth every 6 (six) hours as needed.   SULFAMETHOXAZOLE-TRIMETHOPRIM (BACTRIM DS,SEPTRA DS) 800-160 MG TABLET    Take 1 tablet by mouth daily.  Modified Medications   No medications on file  Discontinued Medications   No medications on file    Allergies: No Known Allergies  Past Medical History: Past Medical History:  Diagnosis Date  . HIV (human immunodeficiency virus infection) (HCC)     Social History:  Social History   Socioeconomic History  . Marital status: Legally Separated    Spouse name: Not on file  . Number of children: Not on file  . Years of education: Not on file  . Highest education level: Not on file  Occupational History  . Not on file  Social Needs  . Financial resource strain: Not on file  . Food insecurity:    Worry: Not on file    Inability: Not on file  . Transportation needs:    Medical: Not on file    Non-medical: Not on file  Tobacco Use  . Smoking status: Current Every Day Smoker    Packs/day: 1.50    Years: 30.00    Pack years: 45.00    Types: Cigarettes  . Smokeless tobacco: Never Used  . Tobacco comment: has not been able to cut back  Substance and Sexual Activity  . Alcohol use: Yes    Alcohol/week: 42.0 standard drinks    Types: 42 Standard drinks or equivalent per week    Comment: a six pack of beer a day  . Drug use: No  . Sexual activity: Yes    Partners: Female    Birth control/protection: Condom    Comment: pt declined condoms  Lifestyle  . Physical activity:    Days per week: Not on file    Minutes per session: Not on file  . Stress: Not on file  Relationships  . Social connections:    Talks on phone: Not on file    Gets together: Not on file  Attends religious service: Not on file    Active member of club or organization: Not on file    Attends meetings of clubs or organizations: Not on file    Relationship status: Not on file  Other Topics Concern  . Not on file  Social History Narrative  . Not on file    Labs: Lab Results  Component Value Date   HIV1RNAQUANT 1,210 (H) 07/24/2018   HIV1RNAQUANT <20 DETECTED (A) 11/30/2016   HIV1RNAQUANT 22 (H) 08/30/2016   CD4TABS 170 (L) 07/24/2018   CD4TABS 300 (L) 11/30/2016   CD4TABS 280 (L) 08/30/2016    RPR and STI Lab Results  Component Value Date   LABRPR NON-REACTIVE 07/24/2018   LABRPR NON REAC 08/30/2016   LABRPR NON REAC 09/25/2015   LABRPR NON REAC 07/09/2014    LABRPR NON REAC 07/05/2013    STI Results GC CT  07/24/2018 Negative Negative  08/30/2016 Negative Negative  09/25/2015 Negative Negative    Hepatitis B Lab Results  Component Value Date   HEPBSAB POS (A) 02/12/2011   HEPBSAG NEGATIVE 02/12/2011   HEPBCAB NEG 02/12/2011   Hepatitis C No results found for: HEPCAB, HCVRNAPCRQN Hepatitis A Lab Results  Component Value Date   HAV POS (A) 02/12/2011   Lipids: Lab Results  Component Value Date   CHOL 248 (H) 08/30/2016   TRIG 75 08/30/2016   HDL 80 08/30/2016   CHOLHDL 3.1 08/30/2016   VLDL 15 08/30/2016   LDLCALC 153 (H) 08/30/2016    Current HIV Regimen: Biktarvy  Assessment: I spoke with Scott Khan on the phone today and did an evisit for this encounter.  [name] recently saw Dr. Luciana Axe and I to re-engage in care for his HIV infection.  He used to take [employer] and preferred to restart back on that regimen as he tolerated it well with no side effects or issues.  He was having issues with the pharmacy and getting refills and was off of his medications for a few months.  We started him back on Biktarvy in March.  He states that he is doing well. He just picked up a refill of his Biktarvy from [facility].  He isn't having any more issues with the pharmacy or getting refills. No side effects and he states that he has missed no doses since he restarted back on medications. He is taking both Biktarvy and Bactrim and I explained to him why he was taking the Bactrim.  No new medications or issues.  I set him up an appointment to come and see me in a month or so to recheck labs since we could not do that today.  He agrees with the plan.  Plan: - Continue Biktarvy PO once daily - F/u with me again 6/1 at 315pm  I discussed the assessment and treatment plan with the patient. The patient was provided an opportunity to ask questions and all were answered. The patient agreed with the plan and demonstrated an understanding of the instructions.    The patient was advised to call back or seek an in-person evaluation if the symptoms worsen or if the condition fails to improve as anticipated.  I provided 7 minutes of non-face-to-face time during this encounter.  Zion Lint L. Lakyn Alsteen, PharmD, BCIDP, AAHIVP, CPP Infectious Diseases Clinical Pharmacist [facility] 09/20/2018, 1:50 PM

## 2018-10-26 ENCOUNTER — Telehealth: Payer: Self-pay | Admitting: Pharmacist

## 2018-10-26 NOTE — Telephone Encounter (Signed)
COVID-19 Pre-Screening Questions: ° °Do you currently have a fever (>100 °F), chills or unexplained body aches? No   ° °Are you currently experiencing new cough, shortness of breath, sore throat, runny nose? No   °•  °Have you recently travelled outside the state of [place] in the last 14 days? no °•  °1. Have you been in contact with someone that is currently pending confirmation of Covid19 testing or has been confirmed to have the Covid19 virus?  No  ° °

## 2018-10-30 ENCOUNTER — Ambulatory Visit: Payer: Self-pay | Admitting: Pharmacist

## 2018-11-29 ENCOUNTER — Other Ambulatory Visit: Payer: Self-pay

## 2018-12-18 ENCOUNTER — Other Ambulatory Visit: Payer: Self-pay

## 2018-12-18 DIAGNOSIS — B20 Human immunodeficiency virus [HIV] disease: Secondary | ICD-10-CM

## 2018-12-19 LAB — T-HELPER CELL (CD4) - (RCID CLINIC ONLY)
CD4 % Helper T Cell: 34 % (ref 33–65)
CD4 T Cell Abs: 202 /uL — ABNORMAL LOW (ref 400–1790)

## 2018-12-22 LAB — HIV-1 RNA QUANT-NO REFLEX-BLD
HIV 1 RNA Quant: 2060 copies/mL — ABNORMAL HIGH
HIV-1 RNA Quant, Log: 3.31 Log copies/mL — ABNORMAL HIGH

## 2019-01-01 ENCOUNTER — Ambulatory Visit: Payer: Self-pay | Admitting: Internal Medicine

## 2019-01-10 ENCOUNTER — Encounter: Payer: Self-pay | Admitting: Internal Medicine

## 2019-01-10 ENCOUNTER — Other Ambulatory Visit: Payer: Self-pay

## 2019-01-10 ENCOUNTER — Ambulatory Visit (INDEPENDENT_AMBULATORY_CARE_PROVIDER_SITE_OTHER): Payer: Self-pay | Admitting: Internal Medicine

## 2019-01-10 VITALS — BP 136/96 | HR 80 | Temp 97.9°F

## 2019-01-10 DIAGNOSIS — Z23 Encounter for immunization: Secondary | ICD-10-CM

## 2019-01-10 DIAGNOSIS — R740 Nonspecific elevation of levels of transaminase and lactic acid dehydrogenase [LDH]: Secondary | ICD-10-CM

## 2019-01-10 DIAGNOSIS — B20 Human immunodeficiency virus [HIV] disease: Secondary | ICD-10-CM

## 2019-01-10 DIAGNOSIS — R7401 Elevation of levels of liver transaminase levels: Secondary | ICD-10-CM

## 2019-01-10 NOTE — Progress Notes (Signed)
   Subjective:    Patient ID: ELISHAH ASHMORE, male    DOB: [birth year], 58 y.o.   MRN: [id number]  HPI Here for follow up of HIV He has been on Biktarvy but has not received any refills.  He thinks he last took it in early July.  His CD4 is 202 and viral load of 2,060, which is about his baseline.  He states he has not heard from the pharmacy for refills so has not picked them up.  No weight loss, no diarrhea.     Review of Systems  Constitutional: Negative for fatigue.  Gastrointestinal: Negative for diarrhea and nausea.  Skin: Negative for rash.       Objective:   Physical Exam Constitutional:      Appearance: Normal appearance.  Eyes:     General: No scleral icterus. Cardiovascular:     Rate and Rhythm: Normal rate and regular rhythm.     Heart sounds: No murmur.  Pulmonary:     Effort: Pulmonary effort is normal.     Breath sounds: Normal breath sounds.  Skin:    Findings: No rash.  Neurological:     Mental Status: He is alert.  Psychiatric:        Mood and Affect: Mood normal.   SH: + tobacco        Assessment & Plan:

## 2019-01-10 NOTE — Assessment & Plan Note (Signed)
Poorly controlled.  Counseled on the importance of getting and staying on his [place].  I will have him work with our pharmacist team to prompt him with refills and to stay on track.   He will return in 4 weeks and I will do labs then and recounsel.

## 2019-01-10 NOTE — Assessment & Plan Note (Signed)
Up last check in February.  I will recheck with next labs and recheck hepatitis C antibody

## 2019-01-10 NOTE — Assessment & Plan Note (Signed)
Will do Prevnar today and Menveo #2

## 2019-01-11 ENCOUNTER — Telehealth: Payer: Self-pay | Admitting: Pharmacist

## 2019-01-11 NOTE — Telephone Encounter (Signed)
Called [facility] to see if patient could be set up for refill reminders for his [place] as patient stated that he relies heavily on those reminders to go pick up his refill.  Talked to the pharmacist at [facility] and he has been set up for refill reminders in the past but pharmacist put a flag on patient's profile to make sure he is notified when it is time to refill. I will follow up and make sure this is happening.

## 2019-02-07 ENCOUNTER — Encounter: Payer: Self-pay | Admitting: Internal Medicine

## 2019-02-07 ENCOUNTER — Other Ambulatory Visit: Payer: Self-pay

## 2019-02-07 ENCOUNTER — Ambulatory Visit (INDEPENDENT_AMBULATORY_CARE_PROVIDER_SITE_OTHER): Payer: Self-pay | Admitting: Internal Medicine

## 2019-02-07 VITALS — BP 128/90 | HR 90 | Temp 98.6°F

## 2019-02-07 DIAGNOSIS — B20 Human immunodeficiency virus [HIV] disease: Secondary | ICD-10-CM

## 2019-02-07 DIAGNOSIS — Z79899 Other long term (current) drug therapy: Secondary | ICD-10-CM

## 2019-02-07 DIAGNOSIS — R740 Nonspecific elevation of levels of transaminase and lactic acid dehydrogenase [LDH]: Secondary | ICD-10-CM

## 2019-02-07 DIAGNOSIS — R7401 Elevation of levels of liver transaminase levels: Secondary | ICD-10-CM

## 2019-02-07 DIAGNOSIS — Z23 Encounter for immunization: Secondary | ICD-10-CM

## 2019-02-07 DIAGNOSIS — E785 Hyperlipidemia, unspecified: Secondary | ICD-10-CM

## 2019-02-07 NOTE — Assessment & Plan Note (Signed)
I will check his lipid panel today as he is due.

## 2019-02-07 NOTE — Assessment & Plan Note (Signed)
Will recheck his hepatitis C Ab today

## 2019-02-07 NOTE — Progress Notes (Signed)
   Subjective:    Patient ID: Scott Khan, male    DOB: Mar 01, 1961, 58 y.o.   MRN: [id number]  HPI Here for follow up of his HIV Has been uncontrolled off of Biktarvy last visit after not getting refills.  We helped set up reminders with [facility] and he is back on.  CD4 at the time was 202 and viral load 2,060.  He is doing well now and no new issues.  No missed doses and knows he needs to stay on it.  No associated weight loss or diarrhea.   Review of Systems  Constitutional: Negative for fatigue.  Gastrointestinal: Negative for diarrhea and nausea.  Skin: Negative for rash.       Objective:   Physical Exam Constitutional:      Appearance: Normal appearance.  Eyes:     General: No scleral icterus. Cardiovascular:     Rate and Rhythm: Normal rate and regular rhythm.     Heart sounds: No murmur.  Pulmonary:     Effort: Pulmonary effort is normal.     Breath sounds: Normal breath sounds.  Skin:    Findings: No rash.  Neurological:     Mental Status: He is alert.  Psychiatric:        Mood and Affect: Mood normal.   SH: + tobacco        Assessment & Plan:

## 2019-02-07 NOTE — Assessment & Plan Note (Addendum)
Has worsened so will check genotype as well and recheck viral load.  Hopefully back on track I emphasized the importance of staying on the medication Flu shot today RTC 4 months unless concerns from today's labs

## 2019-02-08 LAB — T-HELPER CELL (CD4) - (RCID CLINIC ONLY)
CD4 % Helper T Cell: 34 % (ref 33–65)
CD4 T Cell Abs: 189 /uL — ABNORMAL LOW (ref 400–1790)

## 2019-02-22 LAB — HIV-1 GENOTYPING (RTI,PI,IN INHBTR): HIV-1 Genotype: NOT DETECTED

## 2019-02-22 LAB — LIPID PANEL
Cholesterol: 198 mg/dL (ref <200)
HDL: 77 mg/dL (ref >=40)
LDL Cholesterol (Calc): 107 mg/dL (calc) — ABNORMAL HIGH
Non-HDL Cholesterol (Calc): 121 mg/dL (calc) (ref <130)
Total CHOL/HDL Ratio: 2.6 (calc) (ref <5.0)
Triglycerides: 48 mg/dL (ref <150)

## 2019-02-22 LAB — HIV-1 RNA QUANT-NO REFLEX-BLD
HIV 1 RNA Quant: <20 copies/mL
HIV-1 RNA Quant, Log: <1.3 Log copies/mL

## 2019-02-22 LAB — HEPATITIS C ANTIBODY
Hepatitis C Ab: NONREACTIVE
SIGNAL TO CUT-OFF: 0.09 (ref <1.00)

## 2019-03-26 ENCOUNTER — Other Ambulatory Visit: Payer: Self-pay

## 2019-03-26 DIAGNOSIS — Z20822 Contact with and (suspected) exposure to covid-19: Secondary | ICD-10-CM

## 2019-03-27 LAB — NOVEL CORONAVIRUS, NAA: SARS-CoV-2, NAA: NOT DETECTED

## 2019-04-18 ENCOUNTER — Ambulatory Visit: Payer: Self-pay

## 2019-05-11 ENCOUNTER — Other Ambulatory Visit: Payer: Self-pay | Admitting: Internal Medicine

## 2019-05-11 DIAGNOSIS — B20 Human immunodeficiency virus [HIV] disease: Secondary | ICD-10-CM

## 2019-06-11 ENCOUNTER — Other Ambulatory Visit: Payer: Self-pay

## 2019-06-11 DIAGNOSIS — B20 Human immunodeficiency virus [HIV] disease: Secondary | ICD-10-CM

## 2019-06-12 LAB — T-HELPER CELL (CD4) - (RCID CLINIC ONLY)
CD4 % Helper T Cell: 38 % (ref 33–65)
CD4 T Cell Abs: 245 /uL — ABNORMAL LOW (ref 400–1790)

## 2019-06-16 LAB — HIV-1 RNA QUANT-NO REFLEX-BLD
HIV 1 RNA Quant: <20 copies/mL — AB
HIV-1 RNA Quant, Log: <1.3 Log copies/mL — AB

## 2019-07-02 ENCOUNTER — Other Ambulatory Visit: Payer: Self-pay

## 2019-07-02 ENCOUNTER — Ambulatory Visit (INDEPENDENT_AMBULATORY_CARE_PROVIDER_SITE_OTHER): Payer: Self-pay | Admitting: Internal Medicine

## 2019-07-02 ENCOUNTER — Encounter: Payer: Self-pay | Admitting: Internal Medicine

## 2019-07-02 VITALS — BP 157/101 | HR 80 | Temp 98.5°F | Ht 74.0 in | Wt 182.0 lb

## 2019-07-02 DIAGNOSIS — Z79899 Other long term (current) drug therapy: Secondary | ICD-10-CM

## 2019-07-02 DIAGNOSIS — B20 Human immunodeficiency virus [HIV] disease: Secondary | ICD-10-CM

## 2019-07-02 DIAGNOSIS — Z113 Encounter for screening for infections with a predominantly sexual mode of transmission: Secondary | ICD-10-CM

## 2019-07-02 DIAGNOSIS — Z9189 Other specified personal risk factors, not elsewhere classified: Secondary | ICD-10-CM

## 2019-07-02 DIAGNOSIS — F17200 Nicotine dependence, unspecified, uncomplicated: Secondary | ICD-10-CM

## 2019-07-02 DIAGNOSIS — Z23 Encounter for immunization: Secondary | ICD-10-CM

## 2019-07-02 NOTE — Assessment & Plan Note (Signed)
I discussed cessation and he remains precontemplative

## 2019-07-02 NOTE — Assessment & Plan Note (Signed)
CD4 now stable over 200 and can stop the Bactrim prophylaxis now.

## 2019-07-02 NOTE — Progress Notes (Signed)
   Subjective:    Patient ID: Scott Khan, male    DOB: [birth year], 59 y.o.   MRN: [id number]  HPI Here for follow up of HIV He continues on Biktarvy and denies any missed doses.  No new issues.  Works with [facility] for renewals. CD4 of 245, viral load < 20.  No complaints.    Review of Systems  Constitutional: Negative for unexpected weight change.  Gastrointestinal: Negative for diarrhea and nausea.       Objective:   Physical Exam Constitutional:      Appearance: Normal appearance.  Eyes:     General: No scleral icterus. Cardiovascular:     Rate and Rhythm: Normal rate.  Pulmonary:     Effort: Pulmonary effort is normal.  Neurological:     Mental Status: He is alert.  Psychiatric:        Mood and Affect: Mood normal.   SH: + tobacco        Assessment & Plan:

## 2019-07-02 NOTE — Assessment & Plan Note (Addendum)
Doing well from this standpoint now.  No new issues and rtc 6 months.  2nd pneumovax 23 given today

## 2019-07-02 NOTE — Addendum Note (Signed)
Addended by: Gardiner Barefoot on: 07/02/2019 10:05 AM   Modules accepted: Orders

## 2019-07-30 ENCOUNTER — Other Ambulatory Visit: Payer: Self-pay

## 2019-07-30 DIAGNOSIS — B20 Human immunodeficiency virus [HIV] disease: Secondary | ICD-10-CM

## 2019-07-30 MED ORDER — BIKTARVY 50-200-25 MG PO TABS: 1.0000 | ORAL_TABLET | Freq: Every day | ORAL | 4 refills | Status: DC

## 2019-08-21 ENCOUNTER — Encounter: Payer: Self-pay | Admitting: Internal Medicine

## 2019-09-12 ENCOUNTER — Emergency Department (HOSPITAL_COMMUNITY)
Admission: EM | Admit: 2019-09-12 | Discharge: 2019-09-12 | Disposition: A | Discharge status: Home / Self Care | Payer: Self-pay | Attending: Emergency Medicine | Admitting: Emergency Medicine

## 2019-09-12 ENCOUNTER — Encounter (HOSPITAL_COMMUNITY): Payer: Self-pay

## 2019-09-12 ENCOUNTER — Other Ambulatory Visit: Payer: Self-pay

## 2019-09-12 DIAGNOSIS — B20 Human immunodeficiency virus [HIV] disease: Secondary | ICD-10-CM | POA: Insufficient documentation

## 2019-09-12 DIAGNOSIS — F17210 Nicotine dependence, cigarettes, uncomplicated: Secondary | ICD-10-CM | POA: Insufficient documentation

## 2019-09-12 DIAGNOSIS — H6121 Impacted cerumen, right ear: Secondary | ICD-10-CM | POA: Insufficient documentation

## 2019-09-12 NOTE — ED Notes (Signed)
ED Provider at bedside. 

## 2019-09-12 NOTE — ED Triage Notes (Signed)
Pt reports his right ear is clogged.

## 2019-09-12 NOTE — ED Provider Notes (Signed)
[facility] EMERGENCY DEPARTMENT Provider Note   CSN: [id number] Arrival date & time: 09/12/19  8185     History Chief Complaint  Patient presents with  . Otalgia    Scott Khan is a 59 y.o. male presenting for evaluation of decreased hearing of right ear.  Patient states his sxs began yesterday.  He states he has a mild ache in the right ear, and was clogged.  He tried washing it out with syringe drops, tried Q-tip without improvement.  He denies itching, pain, recent swimming, trauma, drainage.  He denies fevers, chills, nasal congestion or sore throat.  Denies symptoms of left ear.  He has a history of HIV, no medical problems.   HPI     Past Medical History:  Diagnosis Date  . HIV (human immunodeficiency virus infection) [facility])     Patient Active Problem List   Diagnosis Date Noted  . At risk for opportunistic infections 08/15/2018  . Encounter for long-term (current) use of medications 03/21/2014  . Routine screening for STI (sexually transmitted infection) 03/21/2014  . 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 Hyperlipidemia 03/20/2012  . Smoking 03/20/2012  . Transaminitis 11/10/2010  . Human immunodeficiency virus (HIV) disease (HCC) 07/14/2010    Past Surgical History:  Procedure Laterality Date  . PATELLA ARTHROPLASTY         No family history on file.  Social History   Tobacco Use  . Smoking status: Current Every Day Smoker    Packs/day: 1.50    Years: 30.00    Pack years: 45.00    Types: Cigarettes  . Smokeless tobacco: Never Used  . Tobacco comment: has not been able to cut back  Substance Use Topics  . Alcohol use: Yes    Alcohol/week: 42.0 standard drinks    Types: 42 Standard drinks or equivalent per week    Comment: a six pack of beer a day  . Drug use: No    Home Medications Prior to Admission medications   Medication Sig Start Date End Date Taking? Authorizing Provider  bictegravir-emtricitabine-tenofovir AF  (BIKTARVY) 50-200-25 MG TABS tablet Take 1 tablet by mouth daily. 07/30/19   Comer, Belia Heman, MD    Allergies    Patient has no known allergies.  Review of Systems   Review of Systems  Constitutional: Negative for fever.  HENT:       R ear clogged    Physical Exam Updated Vital Signs BP (!) 158/105   Pulse 84   Temp 98.6 F (37 C) (Oral)   Resp 16   Ht 6\' 2"  (1.88 m)   Wt 79.4 kg   SpO2 100%   BMI 22.47 kg/m   Physical Exam Vitals and nursing note reviewed.  Constitutional:      General: He is not in acute distress.    Appearance: He is well-developed.     Comments: Appears nontoxic  HENT:     Head: Normocephalic and atraumatic.     Right Ear: There is impacted cerumen.     Left Ear: There is no impacted cerumen.     Ears:     Comments: Cerumen impaction of the right ear.  Left ear canal clear, TM nonerythematous nonbulging. Pulmonary:     Effort: Pulmonary effort is normal.  Abdominal:     General: There is no distension.  Musculoskeletal:        General: Normal range of motion.     Cervical back: Normal range of motion.  Skin:    General: Skin is warm.     Findings: No rash.  Neurological:     Mental Status: He is alert and oriented to person, place, and time.     ED Results / Procedures / Treatments   Labs (all labs ordered are listed, but only abnormal results are displayed) Labs Reviewed - No data to display  EKG None  Radiology No results found.  Procedures .Ear Cerumen Removal  Date/Time: 09/12/2019 9:47 AM Performed by: Franchot Heidelberg, PA-C Authorized by: Franchot Heidelberg, PA-C   Consent:    Consent obtained:  Verbal   Consent given by:  Patient   Risks discussed:  Bleeding, incomplete removal, infection, dizziness, pain and TM perforation Procedure details:    Location:  R ear   Procedure type: irrigation   Post-procedure details:    Inspection:  TM intact   Hearing quality:  Normal   Patient tolerance of procedure:  Tolerated  well, no immediate complications   (including critical care time)  Medications Ordered in ED Medications - No data to display  ED Course  I have reviewed the triage vital signs and the nursing notes.  Pertinent labs & imaging results that were available during my care of the patient were reviewed by me and considered in my medical decision making (see chart for details).    MDM Rules/Calculators/A&P                      Patient presenting for evaluation of right ear feeling clogged.  On exam, patient has a cerumen impaction.  TM not visualized due to cerumen.  Cerumen removed as described above.  On reevaluation, patient without erythema or bulging of the right TM.  Patient report symptoms are completely resolved.  At this time, patient appears safe for discharge.  return precautions given.  Patient states he understands and agrees to plan.  Final Clinical Impression(s) / ED Diagnoses Final diagnoses:  None    Rx / DC Orders ED Discharge Orders    None       Franchot Heidelberg, PA-C 09/12/19 0947    Virgel Manifold, MD 09/15/19 [phone]

## 2019-09-12 NOTE — Discharge Instructions (Signed)
Try not to use q-tips, as this can cause worsened ear wax buildup Follow up with your primary care doctor as needed for further evaluation.  Return to the ER with any new, worsening, or concerning symptoms.

## 2019-12-22 ENCOUNTER — Other Ambulatory Visit: Payer: Self-pay | Admitting: Internal Medicine

## 2019-12-22 DIAGNOSIS — B20 Human immunodeficiency virus [HIV] disease: Secondary | ICD-10-CM

## 2019-12-24 ENCOUNTER — Telehealth: Payer: Self-pay

## 2019-12-24 NOTE — Telephone Encounter (Signed)
Called patient to get him scheduled to renew his ADAP, there was no answer and no voicemail has been set up

## 2019-12-31 ENCOUNTER — Other Ambulatory Visit: Payer: Self-pay

## 2019-12-31 ENCOUNTER — Ambulatory Visit: Payer: Self-pay

## 2019-12-31 DIAGNOSIS — Z79899 Other long term (current) drug therapy: Secondary | ICD-10-CM

## 2019-12-31 DIAGNOSIS — Z113 Encounter for screening for infections with a predominantly sexual mode of transmission: Secondary | ICD-10-CM

## 2019-12-31 DIAGNOSIS — B20 Human immunodeficiency virus [HIV] disease: Secondary | ICD-10-CM

## 2020-01-01 ENCOUNTER — Encounter: Payer: Self-pay | Admitting: Internal Medicine

## 2020-01-01 LAB — URINE CYTOLOGY ANCILLARY ONLY
Chlamydia: NEGATIVE
Comment: NEGATIVE
Comment: NORMAL
Neisseria Gonorrhea: NEGATIVE

## 2020-01-01 LAB — T-HELPER CELL (CD4) - (RCID CLINIC ONLY)
CD4 % Helper T Cell: 47 % (ref 33–65)
CD4 T Cell Abs: 283 /uL — ABNORMAL LOW (ref 400–1790)

## 2020-01-04 LAB — LIPID PANEL
Cholesterol: 192 mg/dL (ref <200)
HDL: 62 mg/dL (ref >=40)
LDL Cholesterol (Calc): 112 mg/dL (calc) — ABNORMAL HIGH
Non-HDL Cholesterol (Calc): 130 mg/dL (calc) — ABNORMAL HIGH (ref <130)
Total CHOL/HDL Ratio: 3.1 (calc) (ref <5.0)
Triglycerides: 84 mg/dL (ref <150)

## 2020-01-04 LAB — CBC WITH DIFFERENTIAL/PLATELET
Absolute Monocytes: 623 cells/uL (ref 200–950)
Basophils Absolute: 21 cells/uL (ref 0–200)
Basophils Relative: 0.5 %
Eosinophils Absolute: 21 cells/uL (ref 15–500)
Eosinophils Relative: 0.5 %
HCT: 45.1 % (ref 38.5–50.0)
Hemoglobin: 15.2 g/dL (ref 13.2–17.1)
Lymphs Abs: 656 cells/uL — ABNORMAL LOW (ref 850–3900)
MCH: 33 pg (ref 27.0–33.0)
MCHC: 33.7 g/dL (ref 32.0–36.0)
MCV: 97.8 fL (ref 80.0–100.0)
MPV: 9.1 fL (ref 7.5–12.5)
Monocytes Relative: 15.2 %
Neutro Abs: 2780 cells/uL (ref 1500–7800)
Neutrophils Relative %: 67.8 %
Platelets: 244 10*3/uL (ref 140–400)
RBC: 4.61 10*6/uL (ref 4.20–5.80)
RDW: 12.2 % (ref 11.0–15.0)
Total Lymphocyte: 16 %
WBC: 4.1 10*3/uL (ref 3.8–10.8)

## 2020-01-04 LAB — COMPLETE METABOLIC PANEL WITH GFR
AG Ratio: 1.2 (calc) (ref 1.0–2.5)
ALT: 15 U/L (ref 9–46)
AST: 24 U/L (ref 10–35)
Albumin: 4.2 g/dL (ref 3.6–5.1)
Alkaline phosphatase (APISO): 74 U/L (ref 35–144)
BUN: 10 mg/dL (ref 7–25)
CO2: 27 mmol/L (ref 20–32)
Calcium: 9.9 mg/dL (ref 8.6–10.3)
Chloride: 102 mmol/L (ref 98–110)
Creat: 1.14 mg/dL (ref 0.70–1.33)
GFR, Est African American: 82 mL/min/{1.73_m2} (ref >=60)
GFR, Est Non African American: 71 mL/min/{1.73_m2} (ref >=60)
Globulin: 3.4 g/dL (calc) (ref 1.9–3.7)
Glucose, Bld: 104 mg/dL — ABNORMAL HIGH (ref 65–99)
Potassium: 5 mmol/L (ref 3.5–5.3)
Sodium: 136 mmol/L (ref 135–146)
Total Bilirubin: 0.3 mg/dL (ref 0.2–1.2)
Total Protein: 7.6 g/dL (ref 6.1–8.1)

## 2020-01-04 LAB — HIV-1 RNA QUANT-NO REFLEX-BLD
HIV 1 RNA Quant: 47 Copies/mL — ABNORMAL HIGH
HIV-1 RNA Quant, Log: 1.67 Log cps/mL — ABNORMAL HIGH

## 2020-01-04 LAB — RPR: RPR Ser Ql: NONREACTIVE

## 2020-01-09 ENCOUNTER — Emergency Department (HOSPITAL_COMMUNITY)
Admission: EM | Admit: 2020-01-09 | Discharge: 2020-01-09 | Disposition: A | Discharge status: Home / Self Care | Payer: Self-pay | Attending: Emergency Medicine | Admitting: Emergency Medicine

## 2020-01-09 ENCOUNTER — Encounter (HOSPITAL_COMMUNITY): Payer: Self-pay | Admitting: *Deleted

## 2020-01-09 ENCOUNTER — Other Ambulatory Visit: Payer: Self-pay

## 2020-01-09 DIAGNOSIS — F17210 Nicotine dependence, cigarettes, uncomplicated: Secondary | ICD-10-CM | POA: Insufficient documentation

## 2020-01-09 DIAGNOSIS — B20 Human immunodeficiency virus [HIV] disease: Secondary | ICD-10-CM | POA: Insufficient documentation

## 2020-01-09 DIAGNOSIS — H6121 Impacted cerumen, right ear: Secondary | ICD-10-CM | POA: Insufficient documentation

## 2020-01-09 NOTE — ED Triage Notes (Signed)
To ED for eval of right ear pain at night. States he feels like it is clogged with wax. Pt states he was in ED prior for same. Denies dizziness.

## 2020-01-09 NOTE — ED Provider Notes (Signed)
[facility] EMERGENCY DEPARTMENT Provider Note   CSN: [id number] Arrival date & time: 01/09/20  1245     History Chief Complaint  Patient presents with  . Otalgia    Scott Khan is a 59 y.o. male.  59yo M w/ PMH below who p/w right ear problem.  Patient states that he feels like his right ear canal is clogged with wax.  He has had some associated pain.  He has tried to clean it out with Q-tips without success.  He has had this problem previously for which he came to the ED.  The history is provided by the patient.  Otalgia Associated symptoms: hearing loss   Associated symptoms: no cough, no ear discharge and no fever        Past Medical History:  Diagnosis Date  . HIV (human immunodeficiency virus infection) [facility])     Patient Active Problem List   Diagnosis Date Noted  . At risk for opportunistic infections 08/15/2018  . Encounter for long-term (current) use of medications 03/21/2014  . Routine screening for STI (sexually transmitted infection) 03/21/2014  . 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 Hyperlipidemia 03/20/2012  . Smoking 03/20/2012  . Transaminitis 11/10/2010  . Human immunodeficiency virus (HIV) disease (HCC) 07/14/2010    Past Surgical History:  Procedure Laterality Date  . PATELLA ARTHROPLASTY         No family history on file.  Social History   Tobacco Use  . Smoking status: Current Every Day Smoker    Packs/day: 1.50    Years: 30.00    Pack years: 45.00    Types: Cigarettes  . Smokeless tobacco: Never Used  . Tobacco comment: has not been able to cut back  Substance Use Topics  . Alcohol use: Yes    Alcohol/week: 42.0 standard drinks    Types: 42 Standard drinks or equivalent per week    Comment: a six pack of beer a day  . Drug use: No    Home Medications Prior to Admission medications   Medication Sig Start Date End Date Taking? Authorizing Provider  BIKTARVY 50-200-25 MG TABS tablet TAKE 1 TABLET BY MOUTH  DAILY 12/24/19   Gardiner Barefoot, MD  BIKTARVY 50-200-25 MG TABS tablet TAKE 1 TABLET BY MOUTH DAILY 12/24/19   Comer, Belia Heman, MD  Ibuprofen-Acetaminophen 125-250 MG TABS Take 2 tablets by mouth every morning.    [provider]    Allergies    Patient has no known allergies.  Review of Systems   Review of Systems  Constitutional: Negative for fever.  HENT: Positive for ear pain and hearing loss. Negative for ear discharge.   Respiratory: Negative for cough.     Physical Exam Updated Vital Signs BP (!) 140/105   Pulse 78   Temp 98.6 F (37 C)   Resp 17   Ht 6\' 2"  (1.88 m)   Wt 82.1 kg   SpO2 100%   BMI 23.24 kg/m   Physical Exam Vitals and nursing note reviewed.  Constitutional:      General: He is not in acute distress.    Appearance: He is well-developed.  HENT:     Head: Normocephalic and atraumatic.     Left Ear: Tympanic membrane and ear canal normal.     Ears:     Comments: Cerumen impaction R TM Eyes:     Conjunctiva/sclera: Conjunctivae normal.  Musculoskeletal:     Cervical back: Neck supple.  Skin:  General: Skin is warm and dry.  Neurological:     Mental Status: He is alert and oriented to person, place, and time.  Psychiatric:        Judgment: Judgment normal.     ED Results / Procedures / Treatments   Labs (all labs ordered are listed, but only abnormal results are displayed) Labs Reviewed - No data to display  EKG None  Radiology No results found.  Procedures .Ear Cerumen Removal  Date/Time: 01/09/2020 4:32 PM Performed by: Laurence Spates, MD Authorized by: Laurence Spates, MD   Consent:    Consent obtained:  Verbal   Consent given by:  Patient   Risks discussed:  Bleeding, pain and incomplete removal   Alternatives discussed:  Alternative treatment Procedure details:    Location:  R ear   Procedure type: curette   Post-procedure details:    Hearing quality:  Improved   Patient tolerance of procedure:   Tolerated well, no immediate complications Comments:     Removed large amount cerumen w/ curette, then nurse irrigated w/ water/peroxide, improved on reassessment.   (including critical care time)  Medications Ordered in ED Medications - No data to display  ED Course  I have reviewed the triage vital signs and the nursing notes.     MDM Rules/Calculators/A&P                          Cerumen removal at bedside, instructed to use over-the-counter wax removal drops or peroxide at home but strongly recommended against putting anything in his ear like a Q-tip.  Instructed to follow-up with ENT as needed. Final Clinical Impression(s) / ED Diagnoses Final diagnoses:  Impacted cerumen of right ear    Rx / DC Orders ED Discharge Orders    None       Kingslee Mairena, Ambrose Finland, MD 01/09/20 [phone]

## 2020-01-09 NOTE — ED Notes (Signed)
Patient verbalizes understanding of discharge instructions. Opportunity for questioning and answers were provided. Armband removed by staff, pt discharged from ED ambulatory.   

## 2020-01-09 NOTE — ED Notes (Signed)
R ear irrigated with peroxide and saline per verbal order from Dr. Clarene Duke.    Pt tolerated well.

## 2020-01-16 ENCOUNTER — Encounter: Payer: Self-pay | Admitting: Internal Medicine

## 2020-01-16 ENCOUNTER — Ambulatory Visit (INDEPENDENT_AMBULATORY_CARE_PROVIDER_SITE_OTHER): Payer: Self-pay | Admitting: Internal Medicine

## 2020-01-16 ENCOUNTER — Other Ambulatory Visit: Payer: Self-pay

## 2020-01-16 VITALS — BP 142/93 | HR 87 | Temp 97.9°F | Ht 74.0 in | Wt 181.0 lb

## 2020-01-16 DIAGNOSIS — B20 Human immunodeficiency virus [HIV] disease: Secondary | ICD-10-CM

## 2020-01-16 DIAGNOSIS — F17200 Nicotine dependence, unspecified, uncomplicated: Secondary | ICD-10-CM

## 2020-01-16 DIAGNOSIS — Z113 Encounter for screening for infections with a predominantly sexual mode of transmission: Secondary | ICD-10-CM

## 2020-01-16 NOTE — Progress Notes (Signed)
   Subjective:    Patient ID: Scott Khan, male    DOB: 1960-06-28, 59 y.o.   MRN: [id number]  HPI Here for follow up of HIV He continues on Biktarvy and denies any missed doses.  No new issues. CD4 of 283, viral load of 47.  No complaints.  Creat and LFts wnl.  No new issues.     Review of Systems  Constitutional: Negative for fatigue and unexpected weight change.  Gastrointestinal: Negative for diarrhea and nausea.  Skin: Negative for rash.       Objective:   Physical Exam Constitutional:      Appearance: Normal appearance.  Eyes:     General: No scleral icterus. Cardiovascular:     Rate and Rhythm: Normal rate.  Pulmonary:     Effort: Pulmonary effort is normal.  Neurological:     Mental Status: He is alert.  Psychiatric:        Mood and Affect: Mood normal.   SH: + tobacco        Assessment & Plan:

## 2020-01-16 NOTE — Assessment & Plan Note (Signed)
I discussed cessation. He remains precontemplative.

## 2020-01-16 NOTE — Assessment & Plan Note (Signed)
He is doing well with no complaints.  Continue Biktarvy and rtc in 6 months.

## 2020-01-16 NOTE — Assessment & Plan Note (Signed)
Screened negative 

## 2020-07-07 ENCOUNTER — Other Ambulatory Visit: Payer: Self-pay

## 2020-07-11 ENCOUNTER — Other Ambulatory Visit: Payer: Self-pay | Admitting: Internal Medicine

## 2020-07-11 ENCOUNTER — Other Ambulatory Visit: Payer: Self-pay

## 2020-07-11 ENCOUNTER — Ambulatory Visit: Payer: Self-pay

## 2020-07-11 ENCOUNTER — Encounter: Payer: Self-pay | Admitting: Internal Medicine

## 2020-07-11 DIAGNOSIS — B20 Human immunodeficiency virus [HIV] disease: Secondary | ICD-10-CM

## 2020-07-14 LAB — HIV-1 RNA QUANT-NO REFLEX-BLD
HIV 1 RNA Quant: 55 Copies/mL — ABNORMAL HIGH
HIV-1 RNA Quant, Log: 1.74 Log cps/mL — ABNORMAL HIGH

## 2020-07-14 LAB — T-HELPER CELLS (CD4) COUNT (NOT AT ARMC)
Absolute CD4: 354 cells/uL — ABNORMAL LOW (ref 490–1740)
CD4 T Helper %: 50 % (ref 30–61)
Total lymphocyte count: 711 cells/uL — ABNORMAL LOW (ref 850–3900)

## 2020-07-21 ENCOUNTER — Encounter: Payer: Self-pay | Admitting: Internal Medicine

## 2020-08-01 ENCOUNTER — Ambulatory Visit (INDEPENDENT_AMBULATORY_CARE_PROVIDER_SITE_OTHER): Payer: Self-pay | Admitting: Internal Medicine

## 2020-08-01 ENCOUNTER — Encounter: Payer: Self-pay | Admitting: Internal Medicine

## 2020-08-01 ENCOUNTER — Other Ambulatory Visit: Payer: Self-pay

## 2020-08-01 VITALS — BP 148/94 | HR 83 | Temp 98.1°F | Wt 179.0 lb

## 2020-08-01 DIAGNOSIS — Z113 Encounter for screening for infections with a predominantly sexual mode of transmission: Secondary | ICD-10-CM

## 2020-08-01 DIAGNOSIS — Z79899 Other long term (current) drug therapy: Secondary | ICD-10-CM

## 2020-08-01 DIAGNOSIS — F17200 Nicotine dependence, unspecified, uncomplicated: Secondary | ICD-10-CM

## 2020-08-01 DIAGNOSIS — B20 Human immunodeficiency virus [HIV] disease: Secondary | ICD-10-CM

## 2020-08-01 MED ORDER — BIKTARVY 50-200-25 MG PO TABS
1.0000 | ORAL_TABLET | Freq: Every day | ORAL | 6 refills | Status: DC
Start: 2020-08-01 — End: 2021-02-18

## 2020-08-01 NOTE — Assessment & Plan Note (Signed)
I again discussed smoking cessation and he is interested but finds it very difficult.  He though does endorse cutting down and hopeful to someday quit.  I encouraged cessation.

## 2020-08-01 NOTE — Assessment & Plan Note (Signed)
He continues to do well and no changes today.  rtc in 6 months

## 2020-08-01 NOTE — Progress Notes (Signed)
   Subjective:    Patient ID: Scott Khan, male    DOB: 10-06-1960, 60 y.o.   MRN: [id number]  HPI Here for follow up of HIV He continues on Biktarvy with no missed doses.  CD4 of 354 and viral load 55 copies.  No new issues.    Review of Systems  Constitutional: Negative for fatigue.  Gastrointestinal: Negative for diarrhea and nausea.  Skin: Negative for rash.       Objective:   Physical Exam Eyes:     General: No scleral icterus. Cardiovascular:     Rate and Rhythm: Normal rate and regular rhythm.  Pulmonary:     Effort: Pulmonary effort is normal.  Neurological:     General: No focal deficit present.     Mental Status: He is alert.  Psychiatric:        Mood and Affect: Mood normal.   SH: + tobacco        Assessment & Plan:

## 2020-11-26 ENCOUNTER — Other Ambulatory Visit: Payer: Self-pay | Admitting: Family

## 2020-11-26 DIAGNOSIS — B20 Human immunodeficiency virus [HIV] disease: Secondary | ICD-10-CM

## 2020-12-05 ENCOUNTER — Ambulatory Visit: Payer: Self-pay

## 2020-12-15 ENCOUNTER — Other Ambulatory Visit: Payer: Self-pay

## 2020-12-15 ENCOUNTER — Ambulatory Visit: Payer: Self-pay

## 2021-02-06 ENCOUNTER — Other Ambulatory Visit: Payer: Self-pay

## 2021-02-06 DIAGNOSIS — B20 Human immunodeficiency virus [HIV] disease: Secondary | ICD-10-CM

## 2021-02-06 DIAGNOSIS — Z113 Encounter for screening for infections with a predominantly sexual mode of transmission: Secondary | ICD-10-CM

## 2021-02-06 DIAGNOSIS — Z79899 Other long term (current) drug therapy: Secondary | ICD-10-CM

## 2021-02-09 LAB — CBC WITH DIFFERENTIAL/PLATELET
Absolute Monocytes: 710 cells/uL (ref 200–950)
Basophils Absolute: 40 cells/uL (ref 0–200)
Basophils Relative: 1.2 %
Eosinophils Absolute: 59 cells/uL (ref 15–500)
Eosinophils Relative: 1.8 %
HCT: 42.2 % (ref 38.5–50.0)
Hemoglobin: 14.9 g/dL (ref 13.2–17.1)
Lymphs Abs: 677 cells/uL — ABNORMAL LOW (ref 850–3900)
MCH: 33.3 pg — ABNORMAL HIGH (ref 27.0–33.0)
MCHC: 35.3 g/dL (ref 32.0–36.0)
MCV: 94.4 fL (ref 80.0–100.0)
MPV: 8.8 fL (ref 7.5–12.5)
Monocytes Relative: 21.5 %
Neutro Abs: 1815 cells/uL (ref 1500–7800)
Neutrophils Relative %: 55 %
Platelets: 270 10*3/uL (ref 140–400)
RBC: 4.47 10*6/uL (ref 4.20–5.80)
RDW: 12.2 % (ref 11.0–15.0)
Total Lymphocyte: 20.5 %
WBC: 3.3 10*3/uL — ABNORMAL LOW (ref 3.8–10.8)

## 2021-02-09 LAB — COMPLETE METABOLIC PANEL WITH GFR
AG Ratio: 1.2 (calc) (ref 1.0–2.5)
ALT: 23 U/L (ref 9–46)
AST: 37 U/L — ABNORMAL HIGH (ref 10–35)
Albumin: 4.5 g/dL (ref 3.6–5.1)
Alkaline phosphatase (APISO): 73 U/L (ref 35–144)
BUN/Creatinine Ratio: 6 (calc) (ref 6–22)
BUN: 6 mg/dL — ABNORMAL LOW (ref 7–25)
CO2: 27 mmol/L (ref 20–32)
Calcium: 9.8 mg/dL (ref 8.6–10.3)
Chloride: 97 mmol/L — ABNORMAL LOW (ref 98–110)
Creat: 0.96 mg/dL (ref 0.70–1.30)
Globulin: 3.7 g/dL (calc) (ref 1.9–3.7)
Glucose, Bld: 85 mg/dL (ref 65–99)
Potassium: 5 mmol/L (ref 3.5–5.3)
Sodium: 133 mmol/L — ABNORMAL LOW (ref 135–146)
Total Bilirubin: 0.4 mg/dL (ref 0.2–1.2)
Total Protein: 8.2 g/dL — ABNORMAL HIGH (ref 6.1–8.1)
eGFR: 92 mL/min/{1.73_m2} (ref >=60)

## 2021-02-09 LAB — LIPID PANEL
Cholesterol: 194 mg/dL (ref <200)
HDL: 54 mg/dL (ref >=40)
LDL Cholesterol (Calc): 122 mg/dL (calc) — ABNORMAL HIGH
Non-HDL Cholesterol (Calc): 140 mg/dL (calc) — ABNORMAL HIGH (ref <130)
Total CHOL/HDL Ratio: 3.6 (calc) (ref <5.0)
Triglycerides: 82 mg/dL (ref <150)

## 2021-02-09 LAB — T-HELPER CELLS (CD4) COUNT (NOT AT ARMC)
Absolute CD4: 308 cells/uL — ABNORMAL LOW (ref 490–1740)
CD4 T Helper %: 52 % (ref 30–61)
Total lymphocyte count: 589 cells/uL — ABNORMAL LOW (ref 850–3900)

## 2021-02-09 LAB — HIV-1 RNA QUANT-NO REFLEX-BLD
HIV 1 RNA Quant: 26 Copies/mL — ABNORMAL HIGH
HIV-1 RNA Quant, Log: 1.41 Log cps/mL — ABNORMAL HIGH

## 2021-02-09 LAB — RPR: RPR Ser Ql: NONREACTIVE

## 2021-02-18 ENCOUNTER — Ambulatory Visit (INDEPENDENT_AMBULATORY_CARE_PROVIDER_SITE_OTHER): Payer: Self-pay | Admitting: Internal Medicine

## 2021-02-18 ENCOUNTER — Encounter: Payer: Self-pay | Admitting: Internal Medicine

## 2021-02-18 ENCOUNTER — Other Ambulatory Visit: Payer: Self-pay

## 2021-02-18 VITALS — BP 157/99 | HR 83 | Temp 98.5°F | Ht 74.0 in | Wt 176.0 lb

## 2021-02-18 DIAGNOSIS — Z79899 Other long term (current) drug therapy: Secondary | ICD-10-CM

## 2021-02-18 DIAGNOSIS — Z23 Encounter for immunization: Secondary | ICD-10-CM

## 2021-02-18 DIAGNOSIS — Z113 Encounter for screening for infections with a predominantly sexual mode of transmission: Secondary | ICD-10-CM

## 2021-02-18 DIAGNOSIS — F17200 Nicotine dependence, unspecified, uncomplicated: Secondary | ICD-10-CM

## 2021-02-18 DIAGNOSIS — B20 Human immunodeficiency virus [HIV] disease: Secondary | ICD-10-CM

## 2021-02-18 MED ORDER — BIKTARVY 50-200-25 MG PO TABS: 1.0000 | ORAL_TABLET | Freq: Every day | ORAL | 11 refills | Status: DC

## 2021-02-18 NOTE — Assessment & Plan Note (Signed)
Lipid panel noted and managed by his PCP

## 2021-02-18 NOTE — Assessment & Plan Note (Signed)
He continues to do well and no changes indicated.   rtc in 6 months.

## 2021-02-18 NOTE — Progress Notes (Signed)
   Subjective:    Patient ID: Scott Khan, male    DOB: 08/24/1960, 60 y.o.   MRN: [id number]  HPI Here for follow up of HIV He continues on Biktarvy with no missed doses.  CD4 of 308 and viral load 26.  No issues obtaining, taking or tolerating the medication.  Since his last visit he quit smoking 3 months ago!   Review of Systems  Constitutional:  Negative for fatigue.  Gastrointestinal:  Negative for diarrhea and nausea.  Skin:  Negative for rash.      Objective:   Physical Exam Neurological:     Mental Status: He is alert.          Assessment & Plan:

## 2021-02-18 NOTE — Assessment & Plan Note (Signed)
Screened negative 

## 2021-02-18 NOTE — Assessment & Plan Note (Signed)
I congratulated him on smoking cessation. 

## 2021-02-27 ENCOUNTER — Other Ambulatory Visit: Payer: Self-pay | Admitting: Internal Medicine

## 2021-02-27 DIAGNOSIS — B20 Human immunodeficiency virus [HIV] disease: Secondary | ICD-10-CM

## 2021-08-18 ENCOUNTER — Other Ambulatory Visit: Payer: Self-pay

## 2021-08-18 DIAGNOSIS — B20 Human immunodeficiency virus [HIV] disease: Secondary | ICD-10-CM

## 2021-08-19 LAB — T-HELPER CELL (CD4) - (RCID CLINIC ONLY)
CD4 % Helper T Cell: 44 % (ref 33–65)
CD4 T Cell Abs: 343 /uL — ABNORMAL LOW (ref 400–1790)

## 2021-08-20 LAB — HIV-1 RNA QUANT-NO REFLEX-BLD
HIV 1 RNA Quant: 31 Copies/mL — ABNORMAL HIGH
HIV-1 RNA Quant, Log: 1.49 Log cps/mL — ABNORMAL HIGH

## 2021-09-02 ENCOUNTER — Other Ambulatory Visit: Payer: Self-pay

## 2021-09-02 ENCOUNTER — Encounter: Payer: Self-pay | Admitting: Internal Medicine

## 2021-09-02 ENCOUNTER — Ambulatory Visit (INDEPENDENT_AMBULATORY_CARE_PROVIDER_SITE_OTHER): Payer: 59 | Admitting: Internal Medicine

## 2021-09-02 VITALS — BP 148/96 | HR 79 | Temp 98.3°F | Wt 184.0 lb

## 2021-09-02 DIAGNOSIS — B20 Human immunodeficiency virus [HIV] disease: Secondary | ICD-10-CM

## 2021-09-02 DIAGNOSIS — Z79899 Other long term (current) drug therapy: Secondary | ICD-10-CM | POA: Diagnosis not present

## 2021-09-02 DIAGNOSIS — Z23 Encounter for immunization: Secondary | ICD-10-CM | POA: Diagnosis not present

## 2021-09-02 DIAGNOSIS — Z113 Encounter for screening for infections with a predominantly sexual mode of transmission: Secondary | ICD-10-CM

## 2021-09-02 MED ORDER — BIKTARVY 50-200-25 MG PO TABS: 1.0000 | ORAL_TABLET | Freq: Every day | ORAL | 11 refills | Status: DC

## 2021-09-02 NOTE — Assessment & Plan Note (Addendum)
Doing well on Biktarvy and refills provided.   ?I have personally spent 30 minutes involved in face-to-face and non-face-to-face activities for this patient on the day of the visit. Professional time spent includes the following activities: Preparing to see the patient (review of tests), Obtaining and/or reviewing separately obtained history (admission/discharge record), Performing a medically appropriate examination and/or evaluation , Ordering medications/tests/procedures, referring and communicating with other health care professionals, Documenting clinical information in the EMR, Independently interpreting results (not separately reported), Communicating results to the patient/family/caregiver, Counseling and educating the patient/family/caregiver and Care coordination (not separately reported).  ?

## 2021-09-02 NOTE — Progress Notes (Signed)
? ?  Subjective:  ? ? Patient ID: Scott Khan, male    DOB: 29-Jan-1961, 61 y.o.   MRN: [id number] ? ?HPI ?Here for follow up of HIV ?He continues on Marcellus with no missed doses.  CD4 of 343 and viral load 31 copies.  No issues obtaining, taking or tolerating the medication.   ? ? ?Review of Systems  ?Constitutional:  Negative for fatigue.  ?Gastrointestinal:  Negative for diarrhea and nausea.  ?Skin:  Negative for rash.  ? ?   ?Objective:  ? Physical Exam ?Eyes:  ?   General: No scleral icterus. ?Pulmonary:  ?   Effort: Pulmonary effort is normal.  ?Neurological:  ?   General: No focal deficit present.  ?   Mental Status: He is alert.  ?Psychiatric:     ?   Mood and Affect: Mood normal.  ? ?SH: no tobacco ? ? ? ?   ?Assessment & Plan:  ? ? ?

## 2021-09-02 NOTE — Addendum Note (Signed)
Addended by: Tressa Busman T on: 09/02/2021 09:40 AM ? ? Modules accepted: Orders ? ?

## 2021-09-02 NOTE — Assessment & Plan Note (Signed)
Discussed Prevnar-20 and given today °

## 2021-09-03 ENCOUNTER — Other Ambulatory Visit (HOSPITAL_COMMUNITY): Payer: Self-pay

## 2021-09-03 ENCOUNTER — Other Ambulatory Visit: Payer: Self-pay | Admitting: Pharmacist

## 2021-09-03 DIAGNOSIS — B20 Human immunodeficiency virus [HIV] disease: Secondary | ICD-10-CM

## 2021-09-03 MED ORDER — BICTEGRAVIR-EMTRICITAB-TENOFOV 50-200-25 MG PO TABS: 1.0000 | ORAL_TABLET | Freq: Every day | ORAL | 0 refills | Status: DC

## 2021-09-03 NOTE — Progress Notes (Signed)
Medication Samples have been provided to the patient.  Drug name: Biktarvy        Strength: 50/200/25 mg       Qty: 7 tablets (1 bottles) LOT: [id number]   Exp.Date: 10/24  Dosing instructions: Take one tablet by mouth once daily  The patient has been instructed regarding the correct time, dose, and frequency of taking this medication, including desired effects and most common side effects.   Dewitte Vannice, PharmD, CPP Clinical Pharmacist Practitioner Infectious Diseases Clinical Pharmacist [facility]  

## 2021-10-01 ENCOUNTER — Other Ambulatory Visit (HOSPITAL_COMMUNITY): Payer: Self-pay

## 2022-02-17 ENCOUNTER — Other Ambulatory Visit: Payer: Commercial Managed Care - HMO

## 2022-02-17 ENCOUNTER — Other Ambulatory Visit: Payer: Self-pay

## 2022-02-17 DIAGNOSIS — Z79899 Other long term (current) drug therapy: Secondary | ICD-10-CM

## 2022-02-17 DIAGNOSIS — Z113 Encounter for screening for infections with a predominantly sexual mode of transmission: Secondary | ICD-10-CM

## 2022-02-17 DIAGNOSIS — B20 Human immunodeficiency virus [HIV] disease: Secondary | ICD-10-CM

## 2022-02-18 LAB — T-HELPER CELL (CD4) - (RCID CLINIC ONLY)
CD4 % Helper T Cell: 52 % (ref 33–65)
CD4 T Cell Abs: 467 /uL (ref 400–1790)

## 2022-02-20 LAB — COMPLETE METABOLIC PANEL WITH GFR
AG Ratio: 1.3 (calc) (ref 1.0–2.5)
ALT: 22 U/L (ref 9–46)
AST: 28 U/L (ref 10–35)
Albumin: 4.6 g/dL (ref 3.6–5.1)
Alkaline phosphatase (APISO): 80 U/L (ref 35–144)
BUN: 8 mg/dL (ref 7–25)
CO2: 28 mmol/L (ref 20–32)
Calcium: 9.7 mg/dL (ref 8.6–10.3)
Chloride: 98 mmol/L (ref 98–110)
Creat: 1.1 mg/dL (ref 0.70–1.30)
Globulin: 3.6 g/dL (calc) (ref 1.9–3.7)
Glucose, Bld: 94 mg/dL (ref 65–99)
Potassium: 4.7 mmol/L (ref 3.5–5.3)
Sodium: 132 mmol/L — ABNORMAL LOW (ref 135–146)
Total Bilirubin: 0.5 mg/dL (ref 0.2–1.2)
Total Protein: 8.2 g/dL — ABNORMAL HIGH (ref 6.1–8.1)
eGFR: 77 mL/min/{1.73_m2} (ref >=60)

## 2022-02-20 LAB — CBC WITH DIFFERENTIAL/PLATELET
Absolute Monocytes: 664 cells/uL (ref 200–950)
Basophils Absolute: 20 cells/uL (ref 0–200)
Basophils Relative: 0.5 %
Eosinophils Absolute: 28 cells/uL (ref 15–500)
Eosinophils Relative: 0.7 %
HCT: 43.8 % (ref 38.5–50.0)
Hemoglobin: 15.4 g/dL (ref 13.2–17.1)
Lymphs Abs: 932 cells/uL (ref 850–3900)
MCH: 34.2 pg — ABNORMAL HIGH (ref 27.0–33.0)
MCHC: 35.2 g/dL (ref 32.0–36.0)
MCV: 97.3 fL (ref 80.0–100.0)
MPV: 9.4 fL (ref 7.5–12.5)
Monocytes Relative: 16.6 %
Neutro Abs: 2356 cells/uL (ref 1500–7800)
Neutrophils Relative %: 58.9 %
Platelets: 240 10*3/uL (ref 140–400)
RBC: 4.5 10*6/uL (ref 4.20–5.80)
RDW: 12 % (ref 11.0–15.0)
Total Lymphocyte: 23.3 %
WBC: 4 10*3/uL (ref 3.8–10.8)

## 2022-02-20 LAB — LIPID PANEL
Cholesterol: 263 mg/dL — ABNORMAL HIGH (ref <200)
HDL: 68 mg/dL (ref >=40)
LDL Cholesterol (Calc): 174 mg/dL (calc) — ABNORMAL HIGH
Non-HDL Cholesterol (Calc): 195 mg/dL (calc) — ABNORMAL HIGH (ref <130)
Total CHOL/HDL Ratio: 3.9 (calc) (ref <5.0)
Triglycerides: 95 mg/dL (ref <150)

## 2022-02-20 LAB — HIV-1 RNA QUANT-NO REFLEX-BLD
HIV 1 RNA Quant: NOT DETECTED Copies/mL
HIV-1 RNA Quant, Log: NOT DETECTED Log cps/mL

## 2022-02-20 LAB — RPR: RPR Ser Ql: NONREACTIVE

## 2022-03-03 ENCOUNTER — Encounter: Payer: Self-pay | Admitting: Internal Medicine

## 2022-03-03 ENCOUNTER — Ambulatory Visit (INDEPENDENT_AMBULATORY_CARE_PROVIDER_SITE_OTHER): Payer: Commercial Managed Care - HMO | Admitting: Internal Medicine

## 2022-03-03 ENCOUNTER — Other Ambulatory Visit: Payer: Self-pay

## 2022-03-03 VITALS — BP 134/92 | HR 72 | Temp 98.8°F | Ht 74.0 in | Wt 188.0 lb

## 2022-03-03 DIAGNOSIS — Z113 Encounter for screening for infections with a predominantly sexual mode of transmission: Secondary | ICD-10-CM | POA: Diagnosis not present

## 2022-03-03 DIAGNOSIS — Z23 Encounter for immunization: Secondary | ICD-10-CM

## 2022-03-03 DIAGNOSIS — B20 Human immunodeficiency virus [HIV] disease: Secondary | ICD-10-CM | POA: Diagnosis not present

## 2022-03-03 NOTE — Assessment & Plan Note (Signed)
Discussed flu shot and given today 

## 2022-03-03 NOTE — Assessment & Plan Note (Signed)
Screened negative 

## 2022-03-03 NOTE — Progress Notes (Signed)
   Subjective:    Patient ID: Scott Khan, male    DOB: 1961/05/29, 61 y.o.   MRN: [id number]  HPI Here for follow up of HIV He continues on Biktarvy and denies any missed doses.  Labs all reassuring with a CD4 of 467 and not detected virus.  No questions or concerns today.    Review of Systems  Constitutional:  Negative for fatigue.  Gastrointestinal:  Negative for diarrhea.  Skin:  Negative for rash.       Objective:   Physical Exam Eyes:     General: No scleral icterus. Pulmonary:     Effort: Pulmonary effort is normal.  Neurological:     Mental Status: He is alert.   SH: no current tobacco use        Assessment & Plan:

## 2022-03-03 NOTE — Assessment & Plan Note (Signed)
He continues to do well, no issues and no changes indicated.   rtc in 6 months.

## 2022-07-03 ENCOUNTER — Ambulatory Visit (HOSPITAL_COMMUNITY)
Admission: EM | Admit: 2022-07-03 | Discharge: 2022-07-03 | Disposition: A | Discharge status: Home / Self Care | Payer: Commercial Managed Care - HMO | Attending: Emergency Medicine | Admitting: Emergency Medicine

## 2022-07-03 ENCOUNTER — Other Ambulatory Visit: Payer: Self-pay

## 2022-07-03 DIAGNOSIS — L03011 Cellulitis of right finger: Secondary | ICD-10-CM | POA: Diagnosis not present

## 2022-07-03 MED ORDER — CEPHALEXIN 500 MG PO CAPS: 500.0000 mg | ORAL_CAPSULE | Freq: Four times a day (QID) | ORAL | 0 refills | Status: AC

## 2022-07-03 MED ORDER — MUPIROCIN 2 % EX OINT: TOPICAL_OINTMENT | Freq: Two times a day (BID) | CUTANEOUS | 0 refills | Status: AC

## 2022-07-03 NOTE — Discharge Instructions (Addendum)
Incision and drainage performed Perform frequent warm soak to help facilitate drainage Wash daily with warm water and mild soap Keep covered to avoid secondary infection Bactroban cream prescribed to help present infection Keflex prescribed/take as directed Use OTC ibuprofen/tylenol as needed for pain  Return or go to the ED if you have any new or worsening symptoms such as worsening toe pain, nausea, vomiting, increased redness, swelling, fever, chills, etc..Marland Kitchen

## 2022-07-03 NOTE — ED Provider Notes (Signed)
[place]   [id number] 07/03/22 Arrival Time: 74   Chief Complaint  Patient presents with   Hand Problem    SUBJECTIVE: History from: patient.  Scott Khan is a 62 y.o. male presenting to urgent care with a complaint of right hand swelling for the past few days.  Denies any precipitating, injury or trauma.  Localizes the swelling to the right ring finger.  Has tried OTC medication with mild relief.  Symptoms are getting worse with ROM.  Denies similar symptoms in the past.  Denies chills, fever, nausea, vomiting, diarrhea, shortness of breath or chest pain.  ROS: As per HPI.  All other pertinent ROS negative.      Past Medical History:  Diagnosis Date   HIV (human immunodeficiency virus infection) ([facility])    Past Surgical History:  Procedure Laterality Date   PATELLA ARTHROPLASTY     No Known Allergies No current facility-administered medications on file prior to encounter.   Current Outpatient Medications on File Prior to Encounter  Medication Sig Dispense Refill   bictegravir-emtricitabine-tenofovir AF (BIKTARVY) 50-200-25 MG TABS tablet Take 1 tablet by mouth daily. 30 tablet 11   bictegravir-emtricitabine-tenofovir AF (BIKTARVY) 50-200-25 MG TABS tablet Take 1 tablet by mouth daily for 7 days. 7 tablet 0   ibuprofen (ADVIL) 600 MG tablet Take 600 mg by mouth every 6 (six) hours as needed. Takes 1 dose twice a week for headache     Social History   Socioeconomic History   Marital status: Legally Separated    Spouse name: Not on file   Number of children: Not on file   Years of education: Not on file   Highest education level: Not on file  Occupational History   Not on file  Tobacco Use   Smoking status: Former    Packs/day: 1.00    Years: 30.00    Total pack years: 30.00    Types: Cigarettes   Smokeless tobacco: Never   Tobacco comments:    Quit 10/2020  Substance and Sexual Activity   Alcohol use: Yes    Alcohol/week: 42.0 standard drinks of  alcohol    Types: 42 Standard drinks or equivalent per week    Comment: daily   Drug use: Yes    Types: Marijuana    Comment: "every now and then"   Sexual activity: Yes    Partners: Female    Birth control/protection: Condom    Comment: declined condoms  Other Topics Concern   Not on file  Social History Narrative   Not on file   Social Determinants of Health   Financial Resource Strain: Not on file  Food Insecurity: Not on file  Transportation Needs: Not on file  Physical Activity: Not on file  Stress: Not on file  Social Connections: Not on file  Intimate Partner Violence: Not on file   No family history on file.  OBJECTIVE:  Vitals:   07/03/22 1036  BP: (!) 134/92  Pulse: 80  Resp: 18  Temp: 98.4 F (36.9 C)  SpO2: 94%     Physical Exam Vitals and nursing note reviewed.  Constitutional:      General: He is not in acute distress.    Appearance: Normal appearance. He is normal weight. He is not ill-appearing, toxic-appearing or diaphoretic.  Cardiovascular:     Rate and Rhythm: Normal rate and regular rhythm.     Pulses: Normal pulses.     Heart sounds: Normal heart sounds. No murmur heard.  No friction rub. No gallop.  Pulmonary:     Effort: Pulmonary effort is normal. No respiratory distress.     Breath sounds: Normal breath sounds. No stridor. No wheezing, rhonchi or rales.  Chest:     Chest wall: No tenderness.  Musculoskeletal:     Right hand: Swelling present.     Comments: Swelling around the ring finger nail filled with pus.  Swelling extended to the whole finger.  Decreased range of motion.  Neurological:     Mental Status: He is alert and oriented to person, place, and time.      LABS:  No results found for this or any previous visit (from the past 24 hour(s)).   PROCEDURE  Verbal consent obtained. Area cleansed with iodine. Biofreeze used for patient comfort. Small incision using a 11 blade scapel made over the most fluctuant portion  of the abscess.  Purulent drainage expressed.  Patient tolerated procedure well.  Minimal bleeding.      ASSESSMENT & PLAN:  1. Paronychia of finger of right hand     Meds ordered this encounter  Medications   cephALEXin (KEFLEX) 500 MG capsule    Sig: Take 1 capsule (500 mg total) by mouth 4 (four) times daily for 5 days.    Dispense:  20 capsule    Refill:  0   mupirocin ointment (BACTROBAN) 2 %    Sig: Apply topically 2 (two) times daily for 7 days.    Dispense:  22 g    Refill:  0    Discharge Instructions  Incision and drainage performed Perform frequent warm soak to help facilitate drainage Wash daily with warm water and mild soap Keep covered to avoid secondary infection Bactroban cream prescribed to help present infection Keflex prescribed/take as directed Use OTC ibuprofen/tylenol as needed for pain  Return or go to the ED if you have any new or worsening symptoms such as worsening toe pain, nausea, vomiting, increased redness, swelling, fever, chills, etc...   Reviewed expectations re: course of current medical issues. Questions answered. Outlined signs and symptoms indicating need for more acute intervention. Patient verbalized understanding. After Visit Summary given.           Emerson Monte, FNP 07/03/22 [phone]

## 2022-07-03 NOTE — ED Triage Notes (Signed)
Pt reports swelling to Rt ring for 5 days. Pt reports swelling is now going up ER arm. Rt reig finger swollen with limited movement .

## 2022-08-24 ENCOUNTER — Other Ambulatory Visit: Payer: Self-pay

## 2022-08-24 ENCOUNTER — Other Ambulatory Visit: Payer: Commercial Managed Care - HMO

## 2022-08-24 DIAGNOSIS — B20 Human immunodeficiency virus [HIV] disease: Secondary | ICD-10-CM

## 2022-08-25 LAB — T-HELPER CELL (CD4) - (RCID CLINIC ONLY)
CD4 % Helper T Cell: 48 % (ref 33–65)
CD4 T Cell Abs: 406 /uL (ref 400–1790)

## 2022-08-27 LAB — HIV-1 RNA QUANT-NO REFLEX-BLD
HIV 1 RNA Quant: NOT DETECTED Copies/mL
HIV-1 RNA Quant, Log: NOT DETECTED Log cps/mL

## 2022-09-07 ENCOUNTER — Ambulatory Visit (INDEPENDENT_AMBULATORY_CARE_PROVIDER_SITE_OTHER): Payer: Commercial Managed Care - HMO | Admitting: Internal Medicine

## 2022-09-07 ENCOUNTER — Encounter: Payer: Self-pay | Admitting: Internal Medicine

## 2022-09-07 ENCOUNTER — Other Ambulatory Visit: Payer: Self-pay

## 2022-09-07 VITALS — BP 125/85 | HR 85 | Temp 97.8°F | Ht 74.0 in | Wt 190.0 lb

## 2022-09-07 DIAGNOSIS — Z113 Encounter for screening for infections with a predominantly sexual mode of transmission: Secondary | ICD-10-CM | POA: Diagnosis not present

## 2022-09-07 DIAGNOSIS — F17200 Nicotine dependence, unspecified, uncomplicated: Secondary | ICD-10-CM | POA: Diagnosis not present

## 2022-09-07 DIAGNOSIS — B20 Human immunodeficiency virus [HIV] disease: Secondary | ICD-10-CM

## 2022-09-07 DIAGNOSIS — Z79899 Other long term (current) drug therapy: Secondary | ICD-10-CM

## 2022-09-07 DIAGNOSIS — IMO0001 Reserved for inherently not codable concepts without codable children: Secondary | ICD-10-CM

## 2022-09-07 MED ORDER — BIKTARVY 50-200-25 MG PO TABS: 1.0000 | ORAL_TABLET | Freq: Every day | ORAL | 11 refills | Status: DC

## 2022-09-07 NOTE — Assessment & Plan Note (Signed)
Discussed Reprieve and benefit of taking a statin. He will consider and can discuss next visit

## 2022-09-07 NOTE — Assessment & Plan Note (Signed)
He is doing well, labs reviewed with him and no concerns. Refills provided. Rtc in 6 months.

## 2022-09-07 NOTE — Progress Notes (Signed)
   Subjective:    Patient ID: Scott Khan, male    DOB: 1961/05/07, 62 y.o.   MRN: [id number]  HPI Scott Khan is here for follow up of HIV He has continued on biktarvy with no issues.  No problems getting or taking his medication.  No complaints today.  Recently got married.     Review of Systems  Constitutional:  Negative for fatigue.  Gastrointestinal:  Negative for diarrhea and nausea.  Skin:  Negative for rash.       Objective:   Physical Exam Eyes:     General: No scleral icterus. Pulmonary:     Effort: Pulmonary effort is normal.  Neurological:     Mental Status: He is alert.   SH: no tobacco        Assessment & Plan:

## 2022-09-07 NOTE — Assessment & Plan Note (Signed)
Previous smoker, no quit and he remains tobacco free

## 2022-09-26 ENCOUNTER — Other Ambulatory Visit: Payer: Self-pay | Admitting: Internal Medicine

## 2022-09-26 DIAGNOSIS — B20 Human immunodeficiency virus [HIV] disease: Secondary | ICD-10-CM

## 2022-10-19 ENCOUNTER — Other Ambulatory Visit (HOSPITAL_COMMUNITY): Payer: Self-pay

## 2022-10-19 ENCOUNTER — Telehealth: Payer: Self-pay

## 2022-10-19 NOTE — Telephone Encounter (Signed)
Patient contacted Kara Mead with [facility] stating pharmacy is having issues filling his Biktarvy. Called [facility] to follow up on this and was told by pharmacist that rx was accidentally deleted. Will fill rx today and put in prescription.  Will call patient once rx is ready for pickup. Pt updated. Juanita Laster, RMA

## 2022-10-29 ENCOUNTER — Other Ambulatory Visit: Payer: Self-pay

## 2022-10-29 DIAGNOSIS — B20 Human immunodeficiency virus [HIV] disease: Secondary | ICD-10-CM

## 2022-10-29 MED ORDER — BIKTARVY 50-200-25 MG PO TABS
1.0000 | ORAL_TABLET | Freq: Every day | ORAL | 11 refills | Status: DC
Start: 2022-10-29 — End: 2023-03-09

## 2022-10-30 ENCOUNTER — Ambulatory Visit (HOSPITAL_COMMUNITY)
Admission: EM | Admit: 2022-10-30 | Discharge: 2022-10-30 | Disposition: A | Discharge status: Home / Self Care | Payer: Commercial Managed Care - HMO | Attending: Emergency Medicine | Admitting: Emergency Medicine

## 2022-10-30 ENCOUNTER — Encounter (HOSPITAL_COMMUNITY): Payer: Self-pay

## 2022-10-30 DIAGNOSIS — S61432A Puncture wound without foreign body of left hand, initial encounter: Secondary | ICD-10-CM | POA: Diagnosis not present

## 2022-10-30 DIAGNOSIS — Z23 Encounter for immunization: Secondary | ICD-10-CM | POA: Diagnosis not present

## 2022-10-30 MED ORDER — TETANUS-DIPHTH-ACELL PERTUSSIS 5-2.5-18.5 LF-MCG/0.5 IM SUSY
PREFILLED_SYRINGE | INTRAMUSCULAR | Status: AC
  Filled 2022-10-30: qty 0.5

## 2022-10-30 MED ORDER — TETANUS-DIPHTH-ACELL PERTUSSIS 5-2.5-18.5 LF-MCG/0.5 IM SUSY
0.5000 mL | PREFILLED_SYRINGE | Freq: Once | INTRAMUSCULAR | Status: AC
  Administered 2022-10-30: 0.5 mL via INTRAMUSCULAR

## 2022-10-30 MED ORDER — DOXYCYCLINE HYCLATE 100 MG PO CAPS: 100.0000 mg | ORAL_CAPSULE | Freq: Two times a day (BID) | ORAL | 0 refills | Status: AC

## 2022-10-30 NOTE — ED Provider Notes (Signed)
[facility]    CSN: [id number] Arrival date & time: 10/30/22  1001     History   Chief Complaint Chief Complaint  Patient presents with   Hand Injury    HPI Scott Khan is a 62 y.o. male.  Here with concerns of a puncture wound on his left hand Happened 6 days ago. He put his hand on top of rusty fence  No fever. 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 Authorizing Provider  doxycycline (VIBRAMYCIN) 100 MG capsule Take 1 capsule (100 mg total) by mouth 2 (two) times daily for 5 days. 10/30/22 11/04/22 Yes Shlok Raz, Lurena Joiner, PA-C  bictegravir-emtricitabine-tenofovir AF (BIKTARVY) 50-200-25 MG TABS tablet Take 1 tablet by mouth daily for 7 days. 09/03/21 09/10/21  Jennette Kettle, RPH-CPP  bictegravir-emtricitabine-tenofovir AF (BIKTARVY) 50-200-25 MG TABS tablet Take 1 tablet by mouth daily. 10/29/22   Gardiner Barefoot, MD  ibuprofen (ADVIL) 600 MG tablet Take 600 mg by mouth every 6 (six) hours as needed. Takes 1 dose twice a week for headache    [provider]    Family History History reviewed. No pertinent family history.  Social History Social History   Tobacco Use   Smoking status: Former    Packs/day: 1.00     Years: 30.00    Additional pack years: 0.00    Total pack years: 30.00    Types: Cigarettes   Smokeless tobacco: Never   Tobacco comments:    Quit 10/2020  Vaping Use   Vaping Use: Never used  Substance Use Topics   Alcohol use: Yes    Alcohol/week: 42.0 standard drinks of alcohol    Types: 42 Standard drinks or equivalent per week    Comment: daily   Drug use: Yes    Types: Marijuana    Comment: "every now and then"     Allergies   Patient has no known allergies.   Review of Systems Review of Systems As per HPI  Physical Exam Triage Vital Signs ED Triage Vitals  Enc Vitals Group     BP 10/30/22 1017 (!) 132/90     Pulse Rate 10/30/22 1017 79     Resp 10/30/22 1017 16     Temp 10/30/22 1017 98.6 F (37 C)     Temp Source 10/30/22 1017 Oral     SpO2 10/30/22 1017 95 %     Weight --      Height --      Head Circumference --      Peak Flow --      Pain Score 10/30/22 1019 8     Pain Loc --      Pain Edu? --      Excl. in  GC? --    No data found.  Updated Vital Signs BP (!) 132/90 (BP Location: Left Arm)   Pulse 79   Temp 98.6 F (37 C) (Oral)   Resp 16   SpO2 95%    Physical Exam Vitals and nursing note reviewed.  Constitutional:      General: He is not in acute distress. HENT:     Mouth/Throat:     Pharynx: Oropharynx is clear.  Cardiovascular:     Rate and Rhythm: Normal rate and regular rhythm.     Pulses: Normal pulses.  Pulmonary:     Effort: Pulmonary effort is normal.  Musculoskeletal:        General: Tenderness present. No swelling. Normal range of motion.     Comments: Full ROM of left hand, wrist, elbow.  Distal sensation intact all fingers.  Cap refill less than 2 seconds.  Strong radial pulse  Skin:    General: Skin is warm and dry.     Capillary Refill: Capillary refill takes less than 2 seconds.     Findings: Wound (small) present.     Comments: Left hand upper palm has small pinpoint wound. There is no drainage from the area. No  redness or swelling. The area is mildly tender. No streaking   Neurological:     Mental Status: He is alert and oriented to person, place, and time.     Sensory: No sensory deficit.     UC Treatments / Results  Labs (all labs ordered are listed, but only abnormal results are displayed) Labs Reviewed - No data to display  EKG  Radiology No results found.  Procedures Procedures (including critical care time)  Medications Ordered in UC Medications  Tdap (BOOSTRIX) injection 0.5 mL (0.5 mLs Intramuscular Given 10/30/22 1040)    Initial Impression / Assessment and Plan / UC Course  I have reviewed the triage vital signs and the nursing notes.  Pertinent labs & imaging results that were available during my care of the patient were reviewed by me and considered in my medical decision making (see chart for details).  Tdap updated today Cover with doxy BID x 5 days. No signs of infection at this time but discussed symptoms to look for that warrant return. Strict ED precautions. Patient agreeable to plan. No questions at this time  Final Clinical Impressions(s) / UC Diagnoses   Final diagnoses:  Puncture wound of left hand without foreign body, initial encounter     Discharge Instructions      Please take medication as prescribed. Take with food to avoid upset stomach.  We have updated your tetanus today  Please monitor for any signs of infection and return if: Increased pain, redness, swelling, drainage, inability to move the hand, fever, etc     ED Prescriptions     Medication Sig Dispense Auth. Provider   doxycycline (VIBRAMYCIN) 100 MG capsule Take 1 capsule (100 mg total) by mouth 2 (two) times daily for 5 days. 10 capsule Margery Szostak, Lurena Joiner, PA-C      PDMP not reviewed this encounter.   Rucha Wissinger, Lurena Joiner, PA-C 10/30/22 1100

## 2022-10-30 NOTE — Discharge Instructions (Signed)
Please take medication as prescribed. Take with food to avoid upset stomach.  We have updated your tetanus today  Please monitor for any signs of infection and return if: Increased pain, redness, swelling, drainage, inability to move the hand, fever, etc

## 2022-10-30 NOTE — ED Triage Notes (Signed)
Patient reports on Mother's Day he was grilling and stuck his hand on a point of a rusty fence and had a small puncture. Patient is concerned about this puncture in th epalm of his left hand.

## 2022-11-01 ENCOUNTER — Other Ambulatory Visit (HOSPITAL_COMMUNITY): Payer: Self-pay

## 2023-01-07 NOTE — Progress Notes (Signed)
The 10-year ASCVD risk score (Arnett DK, et al., 2019) is: 14.4%   Values used to calculate the score:     Age: 62 years     Sex: Male     Is Non-Hispanic African American: Yes     Diabetic: No     Tobacco smoker: Yes     Systolic Blood Pressure: 132 mmHg     Is BP treated: No     HDL Cholesterol: 68 mg/dL     Total Cholesterol: 263 mg/dL  Sandie Ano, RN

## 2023-01-14 ENCOUNTER — Ambulatory Visit (INDEPENDENT_AMBULATORY_CARE_PROVIDER_SITE_OTHER): Payer: Medicaid Other | Admitting: Podiatry

## 2023-01-14 ENCOUNTER — Other Ambulatory Visit: Payer: Self-pay | Admitting: Podiatry

## 2023-01-14 DIAGNOSIS — Z79899 Other long term (current) drug therapy: Secondary | ICD-10-CM

## 2023-01-14 DIAGNOSIS — B351 Tinea unguium: Secondary | ICD-10-CM

## 2023-01-14 NOTE — Progress Notes (Signed)
  Subjective:  Patient ID: Scott Khan, male    DOB: 31-Jul-1960,  MRN: [id number]  Chief Complaint  Patient presents with   Nail Problem   62 y.o. male returns for the above complaint.  Patient presents with thickened elongated dystrophic toenails x 10 mild pain on palpation.  He would like to discuss treatment options for nail fungus he has not seen anyone else prior to seeing me denies any other acute complaints.  Objective:  There were no vitals filed for this visit. Podiatric Exam: Vascular: dorsalis pedis and posterior tibial pulses are palpable bilateral. Capillary return is immediate. Temperature gradient is WNL. Skin turgor WNL  Sensorium: Normal Semmes Weinstein monofilament test. Normal tactile sensation bilaterally. Nail Exam: Pt has thick disfigured discolored nails with subungual debris noted bilateral entire nail hallux through fifth toenails.  Pain on palpation to the nails. Ulcer Exam: There is no evidence of ulcer or pre-ulcerative changes or infection. Orthopedic Exam: Muscle tone and strength are WNL. No limitations in general ROM. No crepitus or effusions noted.  Skin: No Porokeratosis. No infection or ulcers    Assessment & Plan:   1. Long-term use of high-risk medication   2. Nail fungus   3. Onychomycosis due to dermatophyte     Patient was evaluated and treated and all questions answered.  Onychomycosis with pain  -Nails palliatively debrided as below. -Educated on self-care  Procedure: Nail Debridement Rationale: pain  Type of Debridement: manual, sharp debridement. Instrumentation: Nail nipper, rotary burr. Number of Nails: 10  Procedures and Treatment: Consent by patient was obtained for treatment procedures. The patient understood the discussion of treatment and procedures well. All questions were answered thoroughly reviewed. Debridement of mycotic and hypertrophic toenails, 1 through 5 bilateral and clearing of subungual debris. No ulceration,  no infection noted.  Return Visit-Office Procedure: Patient instructed to return to the office for a follow up visit 3 months for continued evaluation and treatment.  Nicholes Rough, DPM    No follow-ups on file.

## 2023-01-15 LAB — HEPATIC FUNCTION PANEL
ALT: 19 IU/L (ref 0–44)
AST: 27 IU/L (ref 0–40)
Albumin: 4.5 g/dL (ref 3.8–4.9)
Alkaline Phosphatase: 85 IU/L (ref 44–121)
Bilirubin Total: 0.2 mg/dL (ref 0.0–1.2)
Bilirubin, Direct: <0.1 mg/dL (ref 0.00–0.40)
Total Protein: 7.7 g/dL (ref 6.0–8.5)

## 2023-01-17 MED ORDER — TERBINAFINE HCL 250 MG PO TABS: 250.0000 mg | ORAL_TABLET | Freq: Every day | ORAL | 0 refills | Status: AC

## 2023-01-17 NOTE — Addendum Note (Signed)
Addended by: Nicholes Rough on: 01/17/2023 01:08 PM   Modules accepted: Orders

## 2023-01-18 ENCOUNTER — Telehealth: Payer: Self-pay | Admitting: Podiatry

## 2023-01-18 NOTE — Telephone Encounter (Signed)
Patient called to get answers concerning his blood work. He would like to go over results. If you could advise and contact patient.

## 2023-02-23 ENCOUNTER — Other Ambulatory Visit: Payer: Commercial Managed Care - HMO

## 2023-02-23 ENCOUNTER — Other Ambulatory Visit: Payer: Self-pay

## 2023-02-23 DIAGNOSIS — B20 Human immunodeficiency virus [HIV] disease: Secondary | ICD-10-CM

## 2023-02-23 DIAGNOSIS — Z79899 Other long term (current) drug therapy: Secondary | ICD-10-CM

## 2023-02-23 DIAGNOSIS — Z113 Encounter for screening for infections with a predominantly sexual mode of transmission: Secondary | ICD-10-CM

## 2023-02-23 LAB — CBC WITH DIFFERENTIAL/PLATELET
Absolute Monocytes: 651 cells/uL (ref 200–950)
Basophils Absolute: 21 cells/uL (ref 0–200)
Basophils Relative: 0.6 %
Eosinophils Absolute: 60 cells/uL (ref 15–500)
Eosinophils Relative: 1.7 %
HCT: 45.2 % (ref 38.5–50.0)
Hemoglobin: 15.3 g/dL (ref 13.2–17.1)
Lymphs Abs: 914 cells/uL (ref 850–3900)
MCH: 33.2 pg — ABNORMAL HIGH (ref 27.0–33.0)
MCHC: 33.8 g/dL (ref 32.0–36.0)
MCV: 98 fL (ref 80.0–100.0)
MPV: 9.3 fL (ref 7.5–12.5)
Monocytes Relative: 18.6 %
Neutro Abs: 1855 cells/uL (ref 1500–7800)
Neutrophils Relative %: 53 %
Platelets: 231 10*3/uL (ref 140–400)
RBC: 4.61 10*6/uL (ref 4.20–5.80)
RDW: 12 % (ref 11.0–15.0)
Total Lymphocyte: 26.1 %
WBC: 3.5 10*3/uL — ABNORMAL LOW (ref 3.8–10.8)

## 2023-02-24 LAB — COMPLETE METABOLIC PANEL WITH GFR
AG Ratio: 1.5 (calc) (ref 1.0–2.5)
ALT: 29 U/L (ref 9–46)
AST: 36 U/L — ABNORMAL HIGH (ref 10–35)
Albumin: 4.7 g/dL (ref 3.6–5.1)
Alkaline phosphatase (APISO): 80 U/L (ref 35–144)
BUN: 8 mg/dL (ref 7–25)
CO2: 24 mmol/L (ref 20–32)
Calcium: 10.2 mg/dL (ref 8.6–10.3)
Chloride: 100 mmol/L (ref 98–110)
Creat: 1.08 mg/dL (ref 0.70–1.35)
Globulin: 3.2 g/dL (calc) (ref 1.9–3.7)
Glucose, Bld: 84 mg/dL (ref 65–99)
Potassium: 5.3 mmol/L (ref 3.5–5.3)
Sodium: 136 mmol/L (ref 135–146)
Total Bilirubin: 0.3 mg/dL (ref 0.2–1.2)
Total Protein: 7.9 g/dL (ref 6.1–8.1)
eGFR: 79 mL/min/{1.73_m2} (ref >=60)

## 2023-02-24 LAB — LIPID PANEL
Cholesterol: 307 mg/dL — ABNORMAL HIGH (ref <200)
HDL: 81 mg/dL (ref >=40)
LDL Cholesterol (Calc): 206 mg/dL (calc) — ABNORMAL HIGH
Non-HDL Cholesterol (Calc): 226 mg/dL (calc) — ABNORMAL HIGH (ref <130)
Total CHOL/HDL Ratio: 3.8 (calc) (ref <5.0)
Triglycerides: 88 mg/dL (ref <150)

## 2023-02-24 LAB — RPR: RPR Ser Ql: NONREACTIVE

## 2023-02-25 LAB — T-HELPER CELL (CD4) - (RCID CLINIC ONLY)
CD4 % Helper T Cell: 51 % (ref 33–65)
CD4 T Cell Abs: 384 /uL — ABNORMAL LOW (ref 400–1790)

## 2023-02-26 LAB — HIV-1 RNA QUANT-NO REFLEX-BLD
HIV 1 RNA Quant: 34 {copies}/mL — ABNORMAL HIGH
HIV-1 RNA Quant, Log: 1.54 {Log} — ABNORMAL HIGH

## 2023-03-09 ENCOUNTER — Other Ambulatory Visit: Payer: Self-pay

## 2023-03-09 ENCOUNTER — Encounter: Payer: Self-pay | Admitting: Internal Medicine

## 2023-03-09 ENCOUNTER — Ambulatory Visit (INDEPENDENT_AMBULATORY_CARE_PROVIDER_SITE_OTHER): Payer: Medicaid Other | Admitting: Internal Medicine

## 2023-03-09 VITALS — BP 150/92 | HR 73 | Temp 98.6°F | Resp 16 | Wt 193.6 lb

## 2023-03-09 DIAGNOSIS — E785 Hyperlipidemia, unspecified: Secondary | ICD-10-CM | POA: Diagnosis not present

## 2023-03-09 DIAGNOSIS — I10 Essential (primary) hypertension: Secondary | ICD-10-CM

## 2023-03-09 DIAGNOSIS — Z23 Encounter for immunization: Secondary | ICD-10-CM

## 2023-03-09 DIAGNOSIS — Z113 Encounter for screening for infections with a predominantly sexual mode of transmission: Secondary | ICD-10-CM

## 2023-03-09 DIAGNOSIS — B20 Human immunodeficiency virus [HIV] disease: Secondary | ICD-10-CM | POA: Diagnosis present

## 2023-03-09 MED ORDER — BIKTARVY 50-200-25 MG PO TABS
1.0000 | ORAL_TABLET | Freq: Every day | ORAL | 11 refills | Status: DC
Start: 2023-03-09 — End: 2024-03-08

## 2023-03-09 NOTE — Assessment & Plan Note (Signed)
He continues to do well with no new concerns.  Labs reviewed with him and stable.   Follow up in 6 months.

## 2023-03-09 NOTE — Assessment & Plan Note (Signed)
Discussed flu shot and given today Declined COVID vaccine

## 2023-03-09 NOTE — Assessment & Plan Note (Signed)
Screened negative 

## 2023-03-09 NOTE — Progress Notes (Signed)
Subjective:    Patient ID: Scott Khan, male    DOB: [birth year], 62 y.o.   MRN: [id number]  HPI Scott Khan is here for follow up of HIV He continues on [place] and denies any missed doses.  No issues with getting or taking his medication.  He gets occasional headaches and takes advil for it.  No vision changes.     Review of Systems  Constitutional:  Negative for fatigue.  Gastrointestinal:  Negative for diarrhea.  Endocrine: Positive for polydipsia.  Skin:  Negative for rash.       Objective:   Physical Exam Eyes:     General: No scleral icterus. Pulmonary:     Effort: Pulmonary effort is normal.  Neurological:     Mental Status: He is alert.   SH: no tobacco        Assessment & Plan:

## 2023-03-09 NOTE — Assessment & Plan Note (Signed)
Lipid panel reviewed with him He is reluctant to start other medication such as a statin Will contiunue to monitor

## 2023-03-09 NOTE — Assessment & Plan Note (Signed)
Noted to be elevated today Has been elevated previously He will follow up with his PCP Discussed reduction of salt

## 2023-03-10 ENCOUNTER — Telehealth: Payer: Self-pay | Admitting: Pharmacy Technician

## 2023-03-10 ENCOUNTER — Telehealth: Payer: Self-pay

## 2023-03-10 ENCOUNTER — Other Ambulatory Visit (HOSPITAL_COMMUNITY): Payer: Self-pay

## 2023-03-10 NOTE — Telephone Encounter (Signed)
Patient called, reports his Susanne Borders is showing a copay of $4000. Call transferred to pharmacy advocate.   Sandie Ano, RN

## 2023-03-10 NOTE — Telephone Encounter (Signed)
Pt recently dropped his [employer] ins and only has [employer] Medicaid. However, they think he still has another ins, so it isn't paying. Advised him to contact his Child psychotherapist or who ever helped him sign up for medicaid, to make sure they get it updated with [employer] and Medicaid. Informed him he could come get samples if he needed to.

## 2023-03-10 NOTE — Telephone Encounter (Addendum)
error 

## 2023-03-21 ENCOUNTER — Other Ambulatory Visit (HOSPITAL_COMMUNITY): Payer: Self-pay

## 2023-03-21 ENCOUNTER — Telehealth: Payer: Self-pay

## 2023-03-21 NOTE — Telephone Encounter (Signed)
Patient called office today stating he is having trouble getting medication from pharmacy. Would like to know if we can help assist with this. Spoke with Carylon Perches tech who will follow up with patient. Juanita Laster, RMA

## 2023-04-18 ENCOUNTER — Other Ambulatory Visit: Payer: Self-pay | Admitting: Podiatry

## 2023-04-19 ENCOUNTER — Other Ambulatory Visit (HOSPITAL_COMMUNITY): Payer: Self-pay

## 2023-04-20 ENCOUNTER — Telehealth: Payer: Self-pay | Admitting: Podiatry

## 2023-04-20 NOTE — Telephone Encounter (Signed)
Patient called in regards to getting a refill on his medication for terbinafine (LAMISIL) 250 MG tablet  Please advise  Thanks!!

## 2023-04-21 NOTE — Telephone Encounter (Signed)
Spoke to patient and advised - he is scheduled for 05/18/23 @ 9:45 am thanks

## 2023-05-18 ENCOUNTER — Ambulatory Visit (INDEPENDENT_AMBULATORY_CARE_PROVIDER_SITE_OTHER): Payer: Medicaid Other | Admitting: Podiatry

## 2023-05-18 DIAGNOSIS — B351 Tinea unguium: Secondary | ICD-10-CM

## 2023-05-18 DIAGNOSIS — M79675 Pain in left toe(s): Secondary | ICD-10-CM

## 2023-05-18 DIAGNOSIS — Z79899 Other long term (current) drug therapy: Secondary | ICD-10-CM

## 2023-05-18 NOTE — Progress Notes (Signed)
  Subjective:  Patient ID: Scott Khan, male    DOB: [birth year],  MRN: [id number]  Chief Complaint  Patient presents with   Nail Problem    He is having some possible fungus on the toe nails. He would like a nail trim,   62 y.o. male returns for the above complaint.  Patient presents with thickened elongated dystrophic toenails x 10 mild pain on palpation. He would also like to continue Lamisil he has no issues with it and no liver problems.  Denies any other acute complaints  Objective:  There were no vitals filed for this visit. Podiatric Exam: Vascular: dorsalis pedis and posterior tibial pulses are palpable bilateral. Capillary return is immediate. Temperature gradient is WNL. Skin turgor WNL  Sensorium: Normal Semmes Weinstein monofilament test. Normal tactile sensation bilaterally. Nail Exam: Pt has thick disfigured discolored nails with subungual debris noted bilateral entire nail hallux through fifth toenails.  Pain on palpation to the nails. Ulcer Exam: There is no evidence of ulcer or pre-ulcerative changes or infection. Orthopedic Exam: Muscle tone and strength are WNL. No limitations in general ROM. No crepitus or effusions noted.  Skin: No Porokeratosis. No infection or ulcers    Assessment & Plan:   1. Long-term use of high-risk medication   2. Nail fungus   3. Onychomycosis due to dermatophyte   4. Pain due to onychomycosis of toenail of left foot     Patient was evaluated and treated and all questions answered.  Onychomycosis with pain ~ second round  -Nails palliatively debrided as below. -Educated on self-care -Educated the patient on the etiology of onychomycosis and various treatment options associated with improving the fungal load.  I explained to the patient that there is 3 treatment options available to treat the onychomycosis including topical, p.o., laser treatment.  Patient elected to undergo p.o. options with Lamisil/terbinafine therapy.  In order for  me to start the medication therapy, I explained to the patient the importance of evaluating the liver and obtaining the liver function test.  Once the liver function test comes back normal I will start him on 29-month course of Lamisil therapy.  Patient understood all risk and would like to proceed with Lamisil therapy.  I have asked the patient to immediately stop the Lamisil therapy if she has any reactions to it and call the office or go to the emergency room right away.  Patient states understanding   Procedure: Nail Debridement Rationale: pain  Type of Debridement: manual, sharp debridement. Instrumentation: Nail nipper, rotary burr. Number of Nails: 10  Procedures and Treatment: Consent by patient was obtained for treatment procedures. The patient understood the discussion of treatment and procedures well. All questions were answered thoroughly reviewed. Debridement of mycotic and hypertrophic toenails, 1 through 5 bilateral and clearing of subungual debris. No ulceration, no infection noted.  Return Visit-Office Procedure: Patient instructed to return to the office for a follow up visit 3 months for continued evaluation and treatment.  Nicholes Rough, DPM    No follow-ups on file.

## 2023-05-19 LAB — HEPATIC FUNCTION PANEL
ALT: 23 [IU]/L (ref 0–44)
AST: 25 [IU]/L (ref 0–40)
Albumin: 4.4 g/dL (ref 3.8–4.9)
Alkaline Phosphatase: 97 [IU]/L (ref 44–121)
Bilirubin Total: 0.4 mg/dL (ref 0.0–1.2)
Bilirubin, Direct: 0.11 mg/dL (ref 0.00–0.40)
Total Protein: 7.4 g/dL (ref 6.0–8.5)

## 2023-05-19 MED ORDER — TERBINAFINE HCL 250 MG PO TABS: 250.0000 mg | ORAL_TABLET | Freq: Every day | ORAL | 0 refills | Status: AC

## 2023-05-19 NOTE — Addendum Note (Signed)
Addended by: Nicholes Rough on: 05/19/2023 08:10 AM   Modules accepted: Orders

## 2023-08-20 ENCOUNTER — Other Ambulatory Visit: Payer: Self-pay | Admitting: Podiatry

## 2023-08-24 ENCOUNTER — Other Ambulatory Visit: Payer: Self-pay

## 2023-08-24 ENCOUNTER — Other Ambulatory Visit: Payer: Managed Care, Other (non HMO)

## 2023-08-24 DIAGNOSIS — B20 Human immunodeficiency virus [HIV] disease: Secondary | ICD-10-CM

## 2023-08-25 LAB — T-HELPER CELL (CD4) - (RCID CLINIC ONLY)
CD4 % Helper T Cell: 49 % (ref 33–65)
CD4 T Cell Abs: 448 /uL (ref 400–1790)

## 2023-08-26 LAB — HIV-1 RNA QUANT-NO REFLEX-BLD
HIV 1 RNA Quant: <20 {copies}/mL — AB
HIV-1 RNA Quant, Log: <1.3 {Log_copies}/mL — AB

## 2023-09-06 NOTE — Progress Notes (Unsigned)
   HPI: Scott Khan is a 63 y.o. male who presents to the [facility] pharmacy clinic for HIV follow-up.  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   IBUPROFEN (ADVIL) 600 MG TABLET    Take 600 mg by mouth every 6 (six) hours as needed. Takes 1 dose twice a week for headache   TERBINAFINE (LAMISIL) 250 MG TABLET    Take 1 tablet (250 mg total) by mouth daily.   TERBINAFINE (LAMISIL) 250 MG TABLET    Take 1 tablet (250 mg total) by mouth daily.  Modified Medications   No medications on file  Discontinued Medications   No medications on file    Labs: Lab Results  Component Value Date   HIV1RNAQUANT <20 DETECTED (A) 08/24/2023   HIV1RNAQUANT 34 (H) 02/23/2023   HIV1RNAQUANT Not Detected 08/24/2022   CD4TABS 448 08/24/2023   CD4TABS 384 (L) 02/23/2023   CD4TABS 406 08/24/2022    RPR and STI Lab Results  Component Value Date   LABRPR NON-REACTIVE 02/23/2023   LABRPR NON-REACTIVE 02/17/2022   LABRPR NON-REACTIVE 02/06/2021   LABRPR NON-REACTIVE 12/31/2019   LABRPR NON-REACTIVE 07/24/2018    STI Results GC CT  12/31/2019 10:38 AM Negative  Negative   07/24/2018 12:00 AM Negative  Negative   08/30/2016 12:00 AM Negative  Negative   09/25/2015 12:00 AM Negative  Negative     Hepatitis B Lab Results  Component Value Date   HEPBSAB POS (A) 02/12/2011   HEPBSAG NEGATIVE  02/12/2011   HEPBCAB NEG 02/12/2011   Hepatitis C Lab Results  Component Value Date   HEPCAB NON-REACTIVE 02/07/2019   Hepatitis A Lab Results  Component Value Date   HAV POS (A) 02/12/2011   Lipids: Lab Results  Component Value Date   CHOL 307 (H) 02/23/2023   TRIG 88 02/23/2023   HDL 81 02/23/2023   CHOLHDL 3.8 02/23/2023   VLDL 15 08/30/2016   LDLCALC 206 (H) 02/23/2023    Current HIV Regimen: Biktarvy  Assessment: 63 yo male presenting for an HIV follow-up appointment. He remains on biktarvy with no reported side effects or issues obtaining or taking the medication. Last HIV RNA < 20 and considered undetectable and CD4 was 448 on 08/24/2023. Last CMP, CBC, and lipid panel in 01/2023. Lipid panel showed elevated LDL of 206 and statin therapy was discussed at last appointment. Pt is *** to statin therapy at this time.   Immunizations: Eligible for COVID and shingles vaccines. He did have a negative sAB for HBV in 2012, had a repeat vaccination since. Will update sAB today.  Plan: -BMP, CBC, and repeat HBV sAB   -Biktarvy x 6 months sent to ***  -F/u on *** with ***

## 2023-09-07 ENCOUNTER — Ambulatory Visit (INDEPENDENT_AMBULATORY_CARE_PROVIDER_SITE_OTHER): Payer: Self-pay | Admitting: Pharmacist

## 2023-09-07 ENCOUNTER — Other Ambulatory Visit: Payer: Self-pay

## 2023-09-07 DIAGNOSIS — Z23 Encounter for immunization: Secondary | ICD-10-CM

## 2023-09-07 DIAGNOSIS — B20 Human immunodeficiency virus [HIV] disease: Secondary | ICD-10-CM | POA: Diagnosis present

## 2023-09-07 MED ORDER — ROSUVASTATIN CALCIUM 20 MG PO TABS: 20.0000 mg | ORAL_TABLET | Freq: Every day | ORAL | 5 refills | Status: DC

## 2023-10-12 NOTE — Progress Notes (Signed)
 The 10-year ASCVD risk score (Arnett DK, et al., 2019) is: 11%   Values used to calculate the score:     Age: 63 years     Sex: Male     Is Non-Hispanic African American: Yes     Diabetic: No     Tobacco smoker: No     Systolic Blood Pressure: 150 mmHg     Is BP treated: No     HDL Cholesterol: 81 mg/dL     Total Cholesterol: 307 mg/dL  Currently prescribed rosuvastatin  20 mg.   Macrae Wiegman, BSN, RN

## 2024-03-08 ENCOUNTER — Other Ambulatory Visit: Payer: Self-pay

## 2024-03-08 ENCOUNTER — Ambulatory Visit: Admitting: Internal Medicine

## 2024-03-08 ENCOUNTER — Encounter: Payer: Self-pay | Admitting: Internal Medicine

## 2024-03-08 VITALS — BP 149/97 | HR 74 | Temp 98.2°F | Wt 195.6 lb

## 2024-03-08 DIAGNOSIS — Z23 Encounter for immunization: Secondary | ICD-10-CM | POA: Diagnosis not present

## 2024-03-08 DIAGNOSIS — B20 Human immunodeficiency virus [HIV] disease: Secondary | ICD-10-CM

## 2024-03-08 DIAGNOSIS — J069 Acute upper respiratory infection, unspecified: Secondary | ICD-10-CM

## 2024-03-08 DIAGNOSIS — E785 Hyperlipidemia, unspecified: Secondary | ICD-10-CM

## 2024-03-08 DIAGNOSIS — Z79899 Other long term (current) drug therapy: Secondary | ICD-10-CM

## 2024-03-08 MED ORDER — BICTEGRAVIR-EMTRICITAB-TENOFOV 50-200-25 MG PO TABS: 1.0000 | ORAL_TABLET | Freq: Every day | ORAL | 11 refills | Status: DC

## 2024-03-08 MED ORDER — ROSUVASTATIN CALCIUM 20 MG PO TABS: 20.0000 mg | ORAL_TABLET | Freq: Every day | ORAL | 3 refills | Status: AC

## 2024-03-08 NOTE — Addendum Note (Signed)
 Addended by: Tersa Fotopoulos M on: 03/08/2024 09:35 AM   Modules accepted: Orders

## 2024-03-08 NOTE — Patient Instructions (Signed)
 You have been doing great  Continue biktarvy    Will revisit in 6 months for labs, then after that every 9-12 months   I renewed biktarvy  and crestor  (rosuvastatin ) today; make sure you take crestor  to reduce risk heart attack/stroke   Also see your primary care doc for colon cancer and prostate cancer screening

## 2024-03-08 NOTE — Progress Notes (Signed)
 HPI: Scott Khan is a 63 y.o. male who presents to the [facility] pharmacy clinic for HIV follow-up.  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   MRN: [id number]  HPI [name] is here for follow up of HIV He continues on Biktarvy  and denies any missed doses.  No issues with getting or taking his medication.  He gets occasional headaches and takes advil  for it.  No vision changes.     03/08/24 id clinic First visit with me today 1.5 weeks dry cough. Feels well otherwise. 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 Pulmonary:     Effort: Pulmonary effort is normal.  Neurological:     Mental Status: He is alert.   Skin: no rash CV: rrr no mrg Msk: no joint swelling/tenderness  SH: no tobacco  Labs: Lab Results  Component Value Date   WBC 3.5 (L) 02/23/2023   HGB 15.3 02/23/2023   HCT 45.2 02/23/2023   MCV 98.0 02/23/2023   PLT 231 02/23/2023   Last metabolic panel Lab Results  Component Value Date   GLUCOSE 84 02/23/2023   NA 136 02/23/2023   K 5.3 02/23/2023   CL 100 02/23/2023   CO2 24  02/23/2023   BUN 8 02/23/2023   CREATININE 1.08 02/23/2023   EGFR 79 02/23/2023   CALCIUM  10.2 02/23/2023   PROT 7.4 05/18/2023   ALBUMIN 4.4 05/18/2023   BILITOT 0.4 05/18/2023   ALKPHOS 97 05/18/2023   AST 25 05/18/2023   ALT 23 05/18/2023   Hiv: 07/2023        <20     /      448 (49%) 01/2023          34 01/2022         ud 01/2021                    /     294  (52%)     Assessment & Plan:    #hiv Dx'ed 2009; heterosexual transmission Wife negative No hx OI Cd4 nadir -- unknown (didn't recall taking bactrim  prophy) Therapy: Atripla --> biktarvy  Started tx right at dx   03/08/24 id assessment Doing well on biktarvy  and wants to continue Has been well controlled since diagnosis will do annual     -discussed u=u -encourage compliance -continue current HIV medication -labs 6 months from now then every year -f/u in 6 months    #uri Looks like mild and improving  -continue supportive care (salt gargle/fluid/rest as needed)    #hcm -vaccine  Flu shot today -hepatitis 2012 hep b sAb positive, sAg and cAb negative 2012 hep c a negative -std Monogamous - deferred -tb screening Prior tb screening negative around 2009 Will repeat tb screening next visit -metabolic/reprieve He ran out of crestor  a few months ago and will renew today 02/2024 -cancer screening Advise patient to f/u pcp for age appropriate cancer screening (never had colon cancer screening)

## 2024-04-04 ENCOUNTER — Other Ambulatory Visit: Payer: Self-pay | Admitting: Internal Medicine

## 2024-04-09 ENCOUNTER — Other Ambulatory Visit: Payer: Self-pay

## 2024-04-09 MED ORDER — BICTEGRAVIR-EMTRICITAB-TENOFOV 50-200-25 MG PO TABS: 1.0000 | ORAL_TABLET | Freq: Every day | ORAL | 11 refills | Status: AC

## 2024-09-06 ENCOUNTER — Other Ambulatory Visit

## 2024-12-12 ENCOUNTER — Ambulatory Visit: Admitting: Internal Medicine
